# Patient Record
Sex: Female | Born: 1980 | Race: Black or African American | Hispanic: No | Marital: Single | Smoking: Former smoker
Health system: Southern US, Community
[De-identification: ages and names within clinical notes are randomized; demographics above are authoritative.]

## PROBLEM LIST (undated history)

## (undated) DIAGNOSIS — O039 Complete or unspecified spontaneous abortion without complication: Secondary | ICD-10-CM

## (undated) DIAGNOSIS — I10 Essential (primary) hypertension: Secondary | ICD-10-CM

## (undated) DIAGNOSIS — E079 Disorder of thyroid, unspecified: Secondary | ICD-10-CM

---

## 1999-03-03 ENCOUNTER — Encounter (INDEPENDENT_AMBULATORY_CARE_PROVIDER_SITE_OTHER): Payer: Self-pay

## 1999-03-03 ENCOUNTER — Inpatient Hospital Stay (HOSPITAL_COMMUNITY): Admission: AD | Admit: 1999-03-03 | Discharge: 1999-03-03 | Payer: Self-pay | Admitting: *Deleted

## 2004-10-01 ENCOUNTER — Ambulatory Visit: Payer: Self-pay | Admitting: Family Medicine

## 2004-10-16 ENCOUNTER — Emergency Department (HOSPITAL_COMMUNITY): Admission: EM | Admit: 2004-10-16 | Discharge: 2004-10-16 | Payer: Self-pay | Admitting: Emergency Medicine

## 2005-01-04 ENCOUNTER — Ambulatory Visit: Payer: Self-pay | Admitting: Family Medicine

## 2005-04-05 ENCOUNTER — Ambulatory Visit: Payer: Self-pay | Admitting: Family Medicine

## 2005-06-13 ENCOUNTER — Ambulatory Visit: Payer: Self-pay | Admitting: Family Medicine

## 2005-09-27 ENCOUNTER — Ambulatory Visit: Payer: Self-pay | Admitting: Family Medicine

## 2006-06-03 ENCOUNTER — Ambulatory Visit: Payer: Self-pay | Admitting: Family Medicine

## 2006-08-15 ENCOUNTER — Emergency Department (HOSPITAL_COMMUNITY): Admission: EM | Admit: 2006-08-15 | Discharge: 2006-08-15 | Payer: Self-pay | Admitting: Emergency Medicine

## 2008-02-02 ENCOUNTER — Other Ambulatory Visit: Admission: RE | Admit: 2008-02-02 | Discharge: 2008-02-02 | Payer: Self-pay | Admitting: Unknown Physician Specialty

## 2008-02-02 ENCOUNTER — Encounter (INDEPENDENT_AMBULATORY_CARE_PROVIDER_SITE_OTHER): Payer: Self-pay | Admitting: Unknown Physician Specialty

## 2012-03-30 ENCOUNTER — Emergency Department (HOSPITAL_COMMUNITY)
Admission: EM | Admit: 2012-03-30 | Discharge: 2012-03-30 | Disposition: A | Discharge status: Home / Self Care | Payer: Self-pay | Attending: Emergency Medicine | Admitting: Emergency Medicine

## 2012-03-30 ENCOUNTER — Encounter (HOSPITAL_COMMUNITY): Payer: Self-pay | Admitting: Emergency Medicine

## 2012-03-30 DIAGNOSIS — I10 Essential (primary) hypertension: Secondary | ICD-10-CM | POA: Insufficient documentation

## 2012-03-30 DIAGNOSIS — J329 Chronic sinusitis, unspecified: Secondary | ICD-10-CM

## 2012-03-30 DIAGNOSIS — H109 Unspecified conjunctivitis: Secondary | ICD-10-CM | POA: Insufficient documentation

## 2012-03-30 DIAGNOSIS — H1030 Unspecified acute conjunctivitis, unspecified eye: Secondary | ICD-10-CM

## 2012-03-30 DIAGNOSIS — F17200 Nicotine dependence, unspecified, uncomplicated: Secondary | ICD-10-CM | POA: Insufficient documentation

## 2012-03-30 HISTORY — DX: Essential (primary) hypertension: I10

## 2012-03-30 MED ORDER — TOBRAMYCIN 0.3 % OP SOLN
1.0000 [drp] | Freq: Once | OPHTHALMIC | Status: AC
  Administered 2012-03-30: 1 [drp] via OPHTHALMIC
  Filled 2012-03-30: qty 5

## 2012-03-30 MED ORDER — AMOXICILLIN 500 MG PO CAPS: 500.0000 mg | ORAL_CAPSULE | Freq: Three times a day (TID) | ORAL | Status: AC

## 2012-03-30 MED ORDER — AZITHROMYCIN 250 MG PO TABS
500.0000 mg | ORAL_TABLET | Freq: Once | ORAL | Status: AC
  Administered 2012-03-30: 500 mg via ORAL
  Filled 2012-03-30: qty 2

## 2012-03-30 NOTE — ED Notes (Signed)
Sore throat and nasal congestion for one week. Woke up with left eye drainage and pain this am.

## 2012-03-30 NOTE — ED Notes (Signed)
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

## 2012-04-04 NOTE — ED Provider Notes (Signed)
History     CSN: [id number]  Arrival date & time 03/30/12  4098   First MD Initiated Contact with Patient 03/30/12 [phone]      Chief Complaint  Patient presents with  . Sore Throat  . Eye Pain    (Consider location/radiation/quality/duration/timing/severity/associated sxs/prior treatment) Patient is a 31 y.o. female presenting with pharyngitis. The history is provided by the patient.  Sore Throat This is a new problem. The current episode started in the past 7 days. The problem occurs constantly. The problem has been unchanged. Associated symptoms include congestion, a sore throat and a visual change. Pertinent negatives include no abdominal pain, arthralgias, chest pain, chills, coughing, fever, headaches, myalgias, nausea, neck pain, numbness, swollen glands, vomiting or weakness. Associated symptoms comments: Sinus pressure and nasal congestion. The symptoms are aggravated by swallowing. She has tried nothing for the symptoms. The treatment provided no relief.    Past Medical History  Diagnosis Date  . Hypertension     History reviewed. No pertinent past surgical history.  History reviewed. No pertinent family history.  History  Substance Use Topics  . Smoking status: Current Some Day Smoker    Types: Cigarettes  . Smokeless tobacco: Not on file  . Alcohol Use: Yes     occ    OB History    Grav Para Term Preterm Abortions TAB SAB Ect Mult Living                  Review of Systems  Constitutional: Negative for fever, chills, activity change and appetite change.  HENT: Positive for congestion, sore throat, rhinorrhea and sinus pressure. Negative for facial swelling, trouble swallowing, neck pain and neck stiffness.   Eyes: Negative for visual disturbance.  Respiratory: Negative for cough, shortness of breath, wheezing and stridor.   Cardiovascular: Negative for chest pain.  Gastrointestinal: Negative for nausea, vomiting and abdominal pain.  Genitourinary: Negative  for dysuria.  Musculoskeletal: Negative for myalgias and arthralgias.  Skin: Negative.   Neurological: Negative for dizziness, weakness, numbness and headaches.  Hematological: Negative for adenopathy.  Psychiatric/Behavioral: Negative for confusion.  All other systems reviewed and are negative.    Allergies  Review of patient's allergies indicates no known allergies.  Home Medications   Current Outpatient Rx  Name Route Sig Dispense Refill  . AMOXICILLIN 500 MG PO CAPS Oral Take 1 capsule (500 mg total) by mouth 3 (three) times daily. For 10 days 30 capsule 0    BP 146/75  Pulse 75  Temp 98.1 F (36.7 C) (Oral)  Resp 16  Ht 5\' 6"  (1.676 m)  Wt 220 lb (99.791 kg)  BMI 35.51 kg/m2  SpO2 100%  LMP 03/01/2012  Physical Exam  Nursing note and vitals reviewed. Constitutional: She is oriented to person, place, and time. She appears well-developed and well-nourished. No distress.  HENT:  Head: Normocephalic and atraumatic. No trismus in the jaw.  Right Ear: Tympanic membrane and ear canal normal.  Left Ear: Tympanic membrane and ear canal normal.  Nose: Mucosal edema and rhinorrhea present.  Mouth/Throat: Uvula is midline and mucous membranes are normal. No uvula swelling. Posterior oropharyngeal erythema present. No oropharyngeal exudate, posterior oropharyngeal edema or tonsillar abscesses.  Eyes: EOM are normal. Pupils are equal, round, and reactive to light. Right eye exhibits no discharge and no hordeolum. No foreign body present in the right eye. Left eye exhibits discharge. Left eye exhibits no hordeolum. No foreign body present in the left eye. Right conjunctiva is not injected.  Right conjunctiva has no hemorrhage. Left conjunctiva is injected. Left conjunctiva has no hemorrhage. No scleral icterus. Left eye exhibits normal extraocular motion.  Neck: Normal range of motion and phonation normal. Neck supple. No Brudzinski's sign and no Kernig's sign noted.  Cardiovascular:  Normal rate, regular rhythm, normal heart sounds and intact distal pulses.   No murmur heard. Pulmonary/Chest: Effort normal and breath sounds normal. She has no wheezes. She has no rales.  Abdominal: Soft. Bowel sounds are normal.  Musculoskeletal: She exhibits no edema.  Lymphadenopathy:    She has no cervical adenopathy.  Neurological: She is alert and oriented to person, place, and time. She exhibits normal muscle tone. Coordination normal.  Skin: Skin is warm and dry.    ED Course  Procedures (including critical care time)  Labs Reviewed - No data to display No results found.   1. Sinusitis   2. Conjunctivitis, acute       MDM   Vitals stable,  Pt is non-toxic appearing.  No focal neuro deficits, no meningeal signs.   Patient agrees to close f/u with PMD or to return here if the symptoms worsen.    The patient appears reasonably screened and/or stabilized for discharge and I doubt any other medical condition or other [facility] requiring further screening, evaluation, or treatment in the ED at this time prior to discharge.    Prescribed amoxil   Angeni Chaudhuri L. [place]ars, [place] 04/04/12 2134

## 2012-04-05 NOTE — ED Provider Notes (Signed)
Medical screening examination/treatment/procedure(s) were performed by non-physician practitioner and as supervising physician I was immediately available for consultation/collaboration.   Josiel Gahm L Jedrick Hutcherson, MD 04/05/12 1235 

## 2013-12-09 ENCOUNTER — Emergency Department (HOSPITAL_COMMUNITY)
Admission: EM | Admit: 2013-12-09 | Discharge: 2013-12-09 | Disposition: A | Discharge status: Home / Self Care | Payer: Medicaid Other | Attending: Emergency Medicine | Admitting: Emergency Medicine

## 2013-12-09 ENCOUNTER — Encounter (HOSPITAL_COMMUNITY): Payer: Self-pay | Admitting: Emergency Medicine

## 2013-12-09 DIAGNOSIS — R259 Unspecified abnormal involuntary movements: Secondary | ICD-10-CM | POA: Insufficient documentation

## 2013-12-09 DIAGNOSIS — R002 Palpitations: Secondary | ICD-10-CM | POA: Insufficient documentation

## 2013-12-09 DIAGNOSIS — F17200 Nicotine dependence, unspecified, uncomplicated: Secondary | ICD-10-CM | POA: Insufficient documentation

## 2013-12-09 DIAGNOSIS — R42 Dizziness and giddiness: Secondary | ICD-10-CM | POA: Insufficient documentation

## 2013-12-09 DIAGNOSIS — I10 Essential (primary) hypertension: Secondary | ICD-10-CM | POA: Insufficient documentation

## 2013-12-09 DIAGNOSIS — H539 Unspecified visual disturbance: Secondary | ICD-10-CM | POA: Insufficient documentation

## 2013-12-09 LAB — CBC WITH DIFFERENTIAL/PLATELET
BASOS ABS: 0 10*3/uL (ref 0.0–0.1)
BASOS PCT: 0 % (ref 0–1)
EOS ABS: 0.1 10*3/uL (ref 0.0–0.7)
Eosinophils Relative: 1 % (ref 0–5)
HCT: 37.5 % (ref 36.0–46.0)
Hemoglobin: 12.2 g/dL (ref 12.0–15.0)
Lymphocytes Relative: 28 % (ref 12–46)
Lymphs Abs: 2.2 10*3/uL (ref 0.7–4.0)
MCH: 23.7 pg — AB (ref 26.0–34.0)
MCHC: 32.5 g/dL (ref 30.0–36.0)
MCV: 73 fL — ABNORMAL LOW (ref 78.0–100.0)
MONO ABS: 0.6 10*3/uL (ref 0.1–1.0)
Monocytes Relative: 8 % (ref 3–12)
NEUTROS PCT: 63 % (ref 43–77)
Neutro Abs: 5.1 10*3/uL (ref 1.7–7.7)
PLATELETS: 438 10*3/uL — AB (ref 150–400)
RBC: 5.14 MIL/uL — ABNORMAL HIGH (ref 3.87–5.11)
RDW: 14.9 % (ref 11.5–15.5)
WBC: 8 10*3/uL (ref 4.0–10.5)

## 2013-12-09 LAB — COMPREHENSIVE METABOLIC PANEL
ALT: 12 U/L (ref 0–35)
AST: 14 U/L (ref 0–37)
Albumin: 3.6 g/dL (ref 3.5–5.2)
Alkaline Phosphatase: 48 U/L (ref 39–117)
BUN: 11 mg/dL (ref 6–23)
CALCIUM: 9 mg/dL (ref 8.4–10.5)
CO2: 27 mEq/L (ref 19–32)
Chloride: 102 mEq/L (ref 96–112)
Creatinine, Ser: 0.76 mg/dL (ref 0.50–1.10)
GFR calc non Af Amer: >90 mL/min (ref >90)
GLUCOSE: 94 mg/dL (ref 70–99)
Potassium: 3.9 mEq/L (ref 3.7–5.3)
SODIUM: 140 meq/L (ref 137–147)
Total Bilirubin: <0.2 mg/dL — ABNORMAL LOW (ref 0.3–1.2)
Total Protein: 7.4 g/dL (ref 6.0–8.3)

## 2013-12-09 NOTE — ED Notes (Signed)
Had a dizzy spell while driving today and had to pull over.  States she still feels shaky.

## 2013-12-09 NOTE — Discharge Instructions (Signed)
Follow up if you have any more episodes

## 2013-12-09 NOTE — ED Provider Notes (Signed)
CSN: [id number]     Arrival date & time 12/09/13  1618 History  This chart was scribed for Tammy LennertJoseph L Tynell Winchell, MD by Dorothey Basemania Sutton, ED Scribe. This patient was seen in room APA15/APA15 and the patient's care was started at 6:29 PM.    Chief Complaint  Patient presents with  . Dizziness   Patient is a 33 y.o. female presenting with dizziness. The history is provided by the patient. No language interpreter was used.  Dizziness Severity:  Moderate Onset quality:  Sudden Timing:  Constant Progression:  Resolved Chronicity:  New Relieved by:  None tried Worsened by:  Nothing tried Ineffective treatments:  None tried Associated symptoms: palpitations and vision changes   Associated symptoms: no chest pain, no diarrhea, no headaches and no nausea   Risk factors: no hx of stroke and no hx of vertigo    HPI Comments: Tammy Rush is a 33 y.o. Female with a history of HTN who presents to the Emergency Department complaining of a sudden onset episode of dizziness, described as "the road wobbling," with associated tremors and palpitations that occurred earlier today while driving. Patient states that this episode caused her to pull over because Tammy Rush was unable to continue driving. Tammy Rush denies any head spinning sensation. Tammy Rush states that the sensation lasted about 3 hours, but all of her symptoms have since resolved. Patient denies nausea. Patient has no other pertinent medical history.   Past Medical History  Diagnosis Date  . Hypertension    History reviewed. No pertinent past surgical history. History reviewed. No pertinent family history. History  Substance Use Topics  . Smoking status: Current Some Day Smoker    Types: Cigarettes  . Smokeless tobacco: Not on file  . Alcohol Use: Yes     Comment: occ   OB History   Grav Para Term Preterm Abortions TAB SAB Ect Mult Living                 Review of Systems  Constitutional: Negative for appetite change and fatigue.  HENT: Negative for  congestion, ear discharge and sinus pressure.   Eyes: Positive for visual disturbance. Negative for discharge.  Respiratory: Negative for cough.   Cardiovascular: Positive for palpitations. Negative for chest pain.  Gastrointestinal: Negative for nausea, abdominal pain and diarrhea.  Genitourinary: Negative for frequency and hematuria.  Musculoskeletal: Negative for back pain.  Skin: Negative for rash.  Neurological: Positive for dizziness and tremors. Negative for seizures and headaches.  Psychiatric/Behavioral: Negative for hallucinations.      Allergies  Review of patient's allergies indicates no known allergies.  Home Medications   Prior to Admission medications   Not on File   Triage Vitals: BP 153/95  Pulse 84  Temp(Src) 97.6 F (36.4 C) (Oral)  Ht 5\' 6"  (1.676 m)  Wt 214 lb (97.07 kg)  BMI 34.56 kg/m2  SpO2 97%  LMP 12/02/2013  Physical Exam  Constitutional: Tammy Rush is oriented to person, place, and time. Tammy Rush appears well-developed.  HENT:  Head: Normocephalic.  Right Ear: Hearing, tympanic membrane, external ear and ear canal normal.  Left Ear: Hearing, tympanic membrane, external ear and ear canal normal.  Eyes: Conjunctivae and EOM are normal. No scleral icterus.  Neck: Neck supple. No thyromegaly present.  Cardiovascular: Normal rate and regular rhythm.  Exam reveals no gallop and no friction rub.   No murmur heard. Pulmonary/Chest: No stridor. Tammy Rush has no wheezes. Tammy Rush has no rales. Tammy Rush exhibits no tenderness.  Abdominal: Tammy Rush exhibits no  distension. There is no tenderness. There is no rebound.  Musculoskeletal: Normal range of motion. Tammy Rush exhibits no edema.  Lymphadenopathy:    Tammy Rush has no cervical adenopathy.  Neurological: Tammy Rush is oriented to person, place, and time. Tammy Rush exhibits normal muscle tone. Coordination normal.  Skin: No rash noted. No erythema.  Psychiatric: Tammy Rush has a normal mood and affect. Her behavior is normal.    ED Course  Procedures  (including critical care time)  DIAGNOSTIC STUDIES: Oxygen Saturation is 97% on room air, normal by my interpretation.    COORDINATION OF CARE: 6:32 PM- Ordered CMP, CBC, and EKG. Discussed treatment plan with patient at bedside and patient verbalized agreement.     Labs Review Labs Reviewed  CBC WITH DIFFERENTIAL - Abnormal; Notable for the following:    RBC 5.14 (*)    MCV 73.0 (*)    MCH 23.7 (*)    Platelets 438 (*)    All other components within normal limits  COMPREHENSIVE METABOLIC PANEL - Abnormal; Notable for the following:    Total Bilirubin <0.2 (*)    All other components within normal limits    Imaging Review No results found.   EKG Interpretation   Date/Time:  Thursday December 09 2013 18:40:47 EDT Ventricular Rate:  75 PR Interval:  196 QRS Duration: 76 QT Interval:  400 QTC Calculation: 446 R Axis:   23 Text Interpretation:  Sinus rhythm with Fusion complexes Otherwise normal  ECG No previous ECGs available Confirmed by Adali Pennings  MD, Jomarie LongsJOSEPH [phone]([id number]) on  12/09/2013 9:23:55 PM      MDM   Final diagnoses:  None   The chart was scribed for me under my direct supervision.  I personally performed the history, physical, and medical decision making and all procedures in the evaluation of this patient.Tammy Lennert.    Roderick Sweezy L Raye Wiens, MD 12/09/13 2125

## 2014-10-29 ENCOUNTER — Emergency Department (HOSPITAL_COMMUNITY)
Admission: EM | Admit: 2014-10-29 | Discharge: 2014-10-29 | Disposition: A | Discharge status: Home / Self Care | Payer: Medicaid Other | Attending: Emergency Medicine | Admitting: Emergency Medicine

## 2014-10-29 ENCOUNTER — Emergency Department (HOSPITAL_COMMUNITY): Payer: Medicaid Other

## 2014-10-29 ENCOUNTER — Encounter (HOSPITAL_COMMUNITY): Payer: Self-pay | Admitting: *Deleted

## 2014-10-29 DIAGNOSIS — O200 Threatened abortion: Secondary | ICD-10-CM

## 2014-10-29 DIAGNOSIS — O10011 Pre-existing essential hypertension complicating pregnancy, first trimester: Secondary | ICD-10-CM | POA: Diagnosis not present

## 2014-10-29 DIAGNOSIS — Z79899 Other long term (current) drug therapy: Secondary | ICD-10-CM | POA: Diagnosis not present

## 2014-10-29 DIAGNOSIS — Z3490 Encounter for supervision of normal pregnancy, unspecified, unspecified trimester: Secondary | ICD-10-CM

## 2014-10-29 DIAGNOSIS — O99331 Smoking (tobacco) complicating pregnancy, first trimester: Secondary | ICD-10-CM | POA: Insufficient documentation

## 2014-10-29 DIAGNOSIS — Z3A01 Less than 8 weeks gestation of pregnancy: Secondary | ICD-10-CM | POA: Insufficient documentation

## 2014-10-29 DIAGNOSIS — F17210 Nicotine dependence, cigarettes, uncomplicated: Secondary | ICD-10-CM | POA: Diagnosis not present

## 2014-10-29 DIAGNOSIS — O209 Hemorrhage in early pregnancy, unspecified: Secondary | ICD-10-CM | POA: Diagnosis present

## 2014-10-29 DIAGNOSIS — O4690 Antepartum hemorrhage, unspecified, unspecified trimester: Secondary | ICD-10-CM

## 2014-10-29 LAB — URINALYSIS, ROUTINE W REFLEX MICROSCOPIC
BILIRUBIN URINE: NEGATIVE
GLUCOSE, UA: NEGATIVE mg/dL
Ketones, ur: NEGATIVE mg/dL
Leukocytes, UA: NEGATIVE
Nitrite: NEGATIVE
Specific Gravity, Urine: 1.02 (ref 1.005–1.030)
Urobilinogen, UA: 1 mg/dL (ref 0.0–1.0)
pH: 7.5 (ref 5.0–8.0)

## 2014-10-29 LAB — WET PREP, GENITAL
Trich, Wet Prep: NONE SEEN
YEAST WET PREP: NONE SEEN

## 2014-10-29 LAB — HCG, QUANTITATIVE, PREGNANCY: HCG, BETA CHAIN, QUANT, S: 437 m[IU]/mL — AB (ref <5)

## 2014-10-29 LAB — POC URINE PREG, ED: Preg Test, Ur: POSITIVE — AB

## 2014-10-29 LAB — ABO/RH: ABO/RH(D): A POS

## 2014-10-29 LAB — URINE MICROSCOPIC-ADD ON

## 2014-10-29 MED ORDER — CEPHALEXIN 500 MG PO CAPS: ORAL_CAPSULE | ORAL | Status: DC

## 2014-10-29 MED ORDER — CEPHALEXIN 500 MG PO CAPS
500.0000 mg | ORAL_CAPSULE | Freq: Once | ORAL | Status: AC
  Administered 2014-10-29: 500 mg via ORAL
  Filled 2014-10-29: qty 1

## 2014-10-29 MED ORDER — NAT-RUL PRENATAL VITAMINS 28-0.8 MG PO TABS: 1.0000 | ORAL_TABLET | Freq: Every morning | ORAL | Status: DC

## 2014-10-29 NOTE — Discharge Instructions (Signed)
Threatened Miscarriage Go to your local health department to get prenatal care. Take the antibiotic as prescribed. You need to stop smoking immediately. Ask your doctor for help. Do not take anymore birth control pills A threatened miscarriage is when you have vaginal bleeding during your first 20 weeks of pregnancy but the pregnancy has not ended. Your doctor will do tests to make sure you are still pregnant. The cause of the bleeding may not be known. This condition does not mean your pregnancy will end. It does increase the risk of it ending (complete miscarriage). HOME CARE   Make sure you keep all your doctor visits for prenatal care.  Get plenty of rest.  Do not have sex or use tampons if you have vaginal bleeding.  Do not douche.  Do not smoke or use drugs.  Do not drink alcohol.  Avoid caffeine. GET HELP IF:  You have light bleeding from your vagina.  You have belly pain or cramping.  You have a fever. GET HELP RIGHT AWAY IF:   You have heavy bleeding from your vagina.  You have clots of blood coming from your vagina.  You have bad pain or cramps in your low back or belly.  You have fever, chills, and bad belly pain. MAKE SURE YOU:   Understand these instructions.  Will watch your condition.  Will get help right away if you are not doing well or get worse. Document Released: 07/18/2008 Document Revised: 08/10/2013 Document Reviewed: 06/01/2013 [facility] Patient Information 2015 [place], [place]. This information is not intended to replace advice given to you by your health care provider. Make sure you discuss any questions you have with your health care provider.

## 2014-10-29 NOTE — ED Provider Notes (Signed)
CSN: [id number]     Arrival date & time 10/29/14  1542 History   First MD Initiated Contact with Patient 10/29/14 1702     Chief Complaint  Patient presents with  . Vaginal Bleeding     (Consider location/radiation/quality/duration/timing/severity/associated sxs/prior Treatment) HPI  Complained of vaginal bleeding onset 4 hours ago noticed slight amount of blood when she wiped. Patient learned she was pregnant by home pregnancy test one week ago. Last normal menstrual period first week of February 2016. She complains of mild cramping in her back. Denies urinary symptoms denies abdominal pain. No other associated symptoms. No treatment prior to coming here. No lightheadedness no other associated symptoms. Nothing makes symptoms better or worse. She stopped her birth control pills last week after learning that she was pregnant Past Medical History  Diagnosis Date  . Hypertension    History reviewed. No pertinent past surgical history. History reviewed. No pertinent family history. History  Substance Use Topics  . Smoking status: Current Some Day Smoker    Types: Cigarettes  . Smokeless tobacco: Not on file  . Alcohol Use: Yes     Comment: occ   OB History    Gravida Para Term Preterm AB TAB SAB Ectopic Multiple Living            1     gravida 3 para 40981 term vaginal delivery, one miscarriage Review of Systems  Genitourinary: Positive for vaginal bleeding.  Musculoskeletal: Positive for back pain.      Allergies  Review of patient's allergies indicates no known allergies.  Home Medications   Prior to Admission medications   Medication Sig Start Date End Date Taking? Authorizing Provider  metoprolol tartrate (LOPRESSOR) 25 MG tablet Take 25 mg by mouth 2 (two) times daily.    Historical Provider, MD  norethindrone (JENCYCLA) 0.35 MG tablet Take 1 tablet by mouth daily.    Historical Provider, MD   BP 126/72 mmHg  Pulse 90  Temp(Src) 98.3 F (36.8 C) (Oral)  Resp 15   Ht  (1.676 m)  Wt 225 lb (102.059 kg)  BMI 36.33 kg/m2  SpO2 100%  LMP 09/19/2014 Physical Exam  Constitutional: She appears well-developed and well-nourished.  HENT:  Head: Normocephalic and atraumatic.  Eyes: Conjunctivae are normal. Pupils are equal, round, and reactive to light.  Neck: Neck supple. No tracheal deviation present. No thyromegaly present.  Cardiovascular: Normal rate and regular rhythm.   No murmur heard. Pulmonary/Chest: Effort normal and breath sounds normal.  Abdominal: Soft. Bowel sounds are normal. She exhibits no distension. There is no tenderness.  Obese  Genitourinary:  No external lesion. Slight amount of blood in vaginal vault. Cervical os closed. No cervical motion tenderness no adnexal masses or tenderness  Musculoskeletal: Normal range of motion. She exhibits no edema or tenderness.  Neurological: She is alert. Coordination normal.  Skin: Skin is warm and dry. No rash noted.  Psychiatric: She has a normal mood and affect.  Nursing note and vitals reviewed.   ED Course  Procedures (including critical care time) Labs Review Labs Reviewed - No data to display  Imaging Review No results found.   EKG Interpretation None      7:40 PM patient resting comfortably. No distress. Results for orders placed or performed during the hospital encounter of 10/29/14  Wet prep, genital  Result Value Ref Range   Yeast Wet Prep HPF POC NONE SEEN NONE SEEN   Trich, Wet Prep NONE SEEN NONE SEEN   Clue  Cells Wet Prep HPF POC FEW (A) NONE SEEN   WBC, Wet Prep HPF POC FEW (A) NONE SEEN  Urinalysis, Routine w reflex microscopic  Result Value Ref Range   Color, Urine YELLOW YELLOW   APPearance HAZY (A) CLEAR   Specific Gravity, Urine 1.020 1.005 - 1.030   pH 7.5 5.0 - 8.0   Glucose, UA NEGATIVE NEGATIVE mg/dL   Hgb urine dipstick LARGE (A) NEGATIVE   Bilirubin Urine NEGATIVE NEGATIVE   Ketones, ur NEGATIVE NEGATIVE mg/dL   Protein, ur TRACE (A) NEGATIVE  mg/dL   Urobilinogen, UA 1.0 0.0 - 1.0 mg/dL   Nitrite NEGATIVE NEGATIVE   Leukocytes, UA NEGATIVE NEGATIVE  hCG, quantitative, pregnancy  Result Value Ref Range   hCG, Beta Chain, Quant, S 437 (H) <5 mIU/mL  Urine microscopic-add on  Result Value Ref Range   Squamous Epithelial / LPF FEW (A) RARE   WBC, UA 0-2 <3 WBC/hpf   RBC / HPF 3-6 <3 RBC/hpf   Bacteria, UA MANY (A) RARE  POC Urine Pregnancy, ED ([facility])  Result Value Ref Range   Preg Test, Ur POSITIVE (A) NEGATIVE  ABO/Rh  Result Value Ref Range   ABO/RH(D) A POS    [place] Ob Comp Less 14 Wks  10/29/2014   CLINICAL DATA:  34 year old G3 P1 SAB1, LMP 09/19/2014 (5 weeks 5 days), positive urine pregnancy test yesterday, quantitative beta HCG 437, presenting with vaginal bleeding.  EXAM: OBSTETRIC <14 WK US AND TRANSVAGINAL OB US  TECHNIQUE: Both transabdominal and transvaginal ultrasound examinations were performed for complete evaluation of the gestation as well as the maternal uterus, adnexal regions, and pelvic cul-de-sac. Transvaginal technique was performed to assess early pregnancy.  COMPARISON:  None.  FINDINGS: Intrauterine gestational sac: Single, normal oval shaped.  Yolk sac:  Not visualized.  Embryo:  Not visualized.  Cardiac Activity: Not visualized.  MSD: 4.3 mm   5 w   1 d  US EDC: 06/30/2015.  Maternal uterus/adnexae: No evidence of subchorionic hemorrhage. Left ovary measures approximately 3.4 x 1.9 x 2.2 cm with visible color Doppler flow. Two adjacent left paraovarian cysts measure approximately 2.4 x 1.4 x 1.8 cm and 1.5 x 0.9 x 1.8 cm. Right ovary normal in appearance measuring approximately 2.7 x 1.8 x 2.2 cm with visible color Doppler flow. No adnexal masses or free pelvic fluid.  IMPRESSION: 1. Single intrauterine gestational sac. Estimated gestational age [redacted] weeks 1 day by mean sac diameter, corresponding well with the estimated gestational age by LMP of 5 weeks 5 days. 2. The embryo and yolk sac are not yet  visualized due to the early gestational age. Continued monitoring of beta HCG and possible follow-up ultrasound in 2 weeks may be beneficial. 3. Paraovarian cysts adjacent to the left ovary. No other adnexal masses or free pelvic fluid.   Electronically Signed   By: Hulan Saashomas  Lawrence M.D.   On: 10/29/2014 19:20   [place] Ob Transvaginal  10/29/2014   CLINICAL DATA:  34 year old G3 P1 SAB1, LMP 09/19/2014 (5 weeks 5 days), positive urine pregnancy test yesterday, quantitative beta HCG 437, presenting with vaginal bleeding.  EXAM: OBSTETRIC <14 WK US AND TRANSVAGINAL OB US  TECHNIQUE: Both transabdominal and transvaginal ultrasound examinations were performed for complete evaluation of the gestation as well as the maternal uterus, adnexal regions, and pelvic cul-de-sac. Transvaginal technique was performed to assess early pregnancy.  COMPARISON:  None.  FINDINGS: Intrauterine gestational sac: Single, normal oval shaped.  Yolk sac:  Not visualized.  Embryo:  Not visualized.  Cardiac Activity: Not visualized.  MSD: 4.3 mm   5 w   1 d  [place] EDC: 06/30/2015.  Maternal uterus/adnexae: No evidence of subchorionic hemorrhage. Left ovary measures approximately 3.4 x 1.9 x 2.2 cm with visible color Doppler flow. Two adjacent left paraovarian cysts measure approximately 2.4 x 1.4 x 1.8 cm and 1.5 x 0.9 x 1.8 cm. Right ovary normal in appearance measuring approximately 2.7 x 1.8 x 2.2 cm with visible color Doppler flow. No adnexal masses or free pelvic fluid.  IMPRESSION: 1. Single intrauterine gestational sac. Estimated gestational age [redacted] weeks 1 day by mean sac diameter, corresponding well with the estimated gestational age by LMP of 5 weeks 5 days. 2. The embryo and yolk sac are not yet visualized due to the early gestational age. Continued monitoring of beta HCG and possible follow-up ultrasound in 2 weeks may be beneficial. 3. Paraovarian cysts adjacent to the left ovary. No other adnexal masses or free pelvic fluid.    Electronically Signed   By: Hulan Saas M.D.   On: 10/29/2014 19:20    MDM  Plan pelvic rest. Consultation for 5 minutes on smoking cessation Prescription prenatal vitamins., keflex, urine for culture Referral health Department for prenatal care Diagnosis #1 threatened abortion #2 bacteriuria #3 tobacco abuse Final diagnoses:  None        Doug Sou, MD 10/29/14 1948

## 2014-10-29 NOTE — ED Notes (Signed)
Ultrasound called in at this time by xray for r/o ectopic . Tammy Rush

## 2014-10-29 NOTE — ED Notes (Signed)
Pt with vaginal bleeding today, just found yesterday with positive pregnancy test, denies any blood clots, hx of miscarriage x 1 in past

## 2014-10-29 NOTE — ED Notes (Signed)
Patient wanting to know how much longer before she is discharged. EDP just left room.

## 2014-10-30 LAB — RPR: RPR: NONREACTIVE

## 2014-10-30 LAB — HIV ANTIBODY (ROUTINE TESTING W REFLEX): HIV SCREEN 4TH GENERATION: NONREACTIVE

## 2014-10-31 ENCOUNTER — Encounter (HOSPITAL_COMMUNITY): Payer: Self-pay

## 2014-10-31 ENCOUNTER — Emergency Department (HOSPITAL_COMMUNITY): Payer: Medicaid Other

## 2014-10-31 ENCOUNTER — Emergency Department (HOSPITAL_COMMUNITY)
Admission: EM | Admit: 2014-10-31 | Discharge: 2014-10-31 | Disposition: A | Discharge status: Home / Self Care | Payer: Medicaid Other | Attending: Emergency Medicine | Admitting: Emergency Medicine

## 2014-10-31 DIAGNOSIS — O99331 Smoking (tobacco) complicating pregnancy, first trimester: Secondary | ICD-10-CM | POA: Diagnosis not present

## 2014-10-31 DIAGNOSIS — F17210 Nicotine dependence, cigarettes, uncomplicated: Secondary | ICD-10-CM | POA: Diagnosis not present

## 2014-10-31 DIAGNOSIS — O99211 Obesity complicating pregnancy, first trimester: Secondary | ICD-10-CM | POA: Insufficient documentation

## 2014-10-31 DIAGNOSIS — O200 Threatened abortion: Secondary | ICD-10-CM | POA: Insufficient documentation

## 2014-10-31 DIAGNOSIS — Z79899 Other long term (current) drug therapy: Secondary | ICD-10-CM | POA: Insufficient documentation

## 2014-10-31 DIAGNOSIS — O039 Complete or unspecified spontaneous abortion without complication: Secondary | ICD-10-CM

## 2014-10-31 DIAGNOSIS — O10011 Pre-existing essential hypertension complicating pregnancy, first trimester: Secondary | ICD-10-CM | POA: Insufficient documentation

## 2014-10-31 DIAGNOSIS — O4690 Antepartum hemorrhage, unspecified, unspecified trimester: Secondary | ICD-10-CM

## 2014-10-31 DIAGNOSIS — Z3A01 Less than 8 weeks gestation of pregnancy: Secondary | ICD-10-CM | POA: Diagnosis not present

## 2014-10-31 DIAGNOSIS — O9989 Other specified diseases and conditions complicating pregnancy, childbirth and the puerperium: Secondary | ICD-10-CM | POA: Diagnosis present

## 2014-10-31 DIAGNOSIS — E663 Overweight: Secondary | ICD-10-CM | POA: Insufficient documentation

## 2014-10-31 LAB — CBC WITH DIFFERENTIAL/PLATELET
BASOS ABS: 0 10*3/uL (ref 0.0–0.1)
Basophils Relative: 0 % (ref 0–1)
EOS PCT: 0 % (ref 0–5)
Eosinophils Absolute: 0 10*3/uL (ref 0.0–0.7)
HEMATOCRIT: 37.7 % (ref 36.0–46.0)
Hemoglobin: 12 g/dL (ref 12.0–15.0)
LYMPHS ABS: 1.6 10*3/uL (ref 0.7–4.0)
Lymphocytes Relative: 23 % (ref 12–46)
MCH: 23.9 pg — ABNORMAL LOW (ref 26.0–34.0)
MCHC: 31.8 g/dL (ref 30.0–36.0)
MCV: 75 fL — AB (ref 78.0–100.0)
MONO ABS: 0.5 10*3/uL (ref 0.1–1.0)
Monocytes Relative: 7 % (ref 3–12)
NEUTROS ABS: 4.8 10*3/uL (ref 1.7–7.7)
Neutrophils Relative %: 70 % (ref 43–77)
Platelets: 372 10*3/uL (ref 150–400)
RBC: 5.03 MIL/uL (ref 3.87–5.11)
RDW: 15 % (ref 11.5–15.5)
WBC: 6.9 10*3/uL (ref 4.0–10.5)

## 2014-10-31 LAB — HCG, QUANTITATIVE, PREGNANCY: hCG, Beta Chain, Quant, S: 415 m[IU]/mL — ABNORMAL HIGH (ref <5)

## 2014-10-31 LAB — URINE CULTURE: Special Requests: NORMAL

## 2014-10-31 LAB — GC/CHLAMYDIA PROBE AMP (~~LOC~~) NOT AT ARMC
CHLAMYDIA, DNA PROBE: NEGATIVE
NEISSERIA GONORRHEA: NEGATIVE

## 2014-10-31 NOTE — ED Provider Notes (Signed)
CSN: [id number]     Arrival date & time 10/31/14  1043 History  This chart was scribed for No att. providers found by Tonye Royalty, ED Scribe. This patient was seen in room APA12/APA12 and the patient's care was started at 11:39 AM.    Chief Complaint  Patient presents with  . Vaginal Bleeding   The history is provided by the patient. No language interpreter was used.    HPI Comments: Tammy Rush is a pregnant 34 y.o. female who presents to the Emergency Department complaining of vaginal bleeding with onset 2 days ago. She was evaluated here 2 days ago and diagnosed with a threatened miscarriage. Since then, she has passed a large clot and bleeding has increased. She reports associated abdominal cramps. Her last period was February 1st. She is G:3 P:1. She denies lightheadedness or other symptoms.  Past Medical History  Diagnosis Date  . Hypertension    History reviewed. No pertinent past surgical history. No family history on file. History  Substance Use Topics  . Smoking status: Current Some Day Smoker    Types: Cigarettes  . Smokeless tobacco: Not on file  . Alcohol Use: Yes     Comment: occ   OB History    Gravida Para Term Preterm AB TAB SAB Ectopic Multiple Living            1     Review of Systems  Respiratory: Negative for chest tightness and shortness of breath.   Cardiovascular: Negative for chest pain.  Gastrointestinal: Positive for abdominal pain. Negative for nausea and vomiting.  Genitourinary: Positive for vaginal bleeding. Negative for dysuria.  Musculoskeletal: Negative for back pain.  Neurological: Negative for dizziness and headaches.  All other systems reviewed and are negative.     Allergies  Review of patient's allergies indicates no known allergies.  Home Medications   Prior to Admission medications   Medication Sig Start Date End Date Taking? Authorizing Provider  metoprolol tartrate (LOPRESSOR) 25 MG tablet Take 25 mg by mouth 2 (two)  times daily.   Yes Historical Provider, MD  norethindrone (JENCYCLA) 0.35 MG tablet Take 1 tablet by mouth daily.   Yes Historical Provider, MD  cephALEXin (KEFLEX) 500 MG capsule 2 caps po bid x 7 days Patient not taking: Reported on 10/31/2014 10/29/14   Doug Sou, MD  Prenatal Vit-Fe Fumarate-FA (NAT-RUL PRENATAL VITAMINS) 28-0.8 MG TABS Take 1 tablet by mouth every morning. Patient not taking: Reported on 10/31/2014 10/29/14   Doug Sou, MD   BP 133/96 mmHg  Pulse 90  Temp(Src) 98.8 F (37.1 C) (Oral)  Resp 18  Ht 5\' 6"  (1.676 m)  Wt 223 lb (101.152 kg)  BMI 36.01 kg/m2  SpO2 100%  LMP 09/19/2014 Physical Exam  Constitutional: She is oriented to person, place, and time. She appears well-developed and well-nourished. No distress.  Overweight  HENT:  Head: Normocephalic and atraumatic.  Cardiovascular: Normal rate, regular rhythm and normal heart sounds.   No murmur heard. Pulmonary/Chest: Effort normal and breath sounds normal. No respiratory distress. She has no wheezes.  Abdominal: Soft. Bowel sounds are normal. There is no tenderness. There is no rebound.  Genitourinary:  Normal external vaginal exam, clot noted in the vaginal vault, cervical os open  Neurological: She is alert and oriented to person, place, and time.  Skin: Skin is warm and dry.  Psychiatric: She has a normal mood and affect.  Nursing note and vitals reviewed.   ED Course  Procedures (including  critical care time)  DIAGNOSTIC STUDIES: Oxygen Saturation is 100% on room air, normal by my interpretation.    COORDINATION OF CARE: 3:31 PM Discussed treatment plan with patient at beside, the patient agrees with the plan and has no further questions at this time.   Labs Review Labs Reviewed  HCG, QUANTITATIVE, PREGNANCY - Abnormal; Notable for the following:    hCG, Beta Chain, Quant, S 415 (*)    All other components within normal limits  CBC WITH DIFFERENTIAL/PLATELET - Abnormal; Notable for  the following:    MCV 75.0 (*)    MCH 23.9 (*)    All other components within normal limits    Imaging Review US Ob Comp Less 14 Wks  10/31/2014   CLINICAL DATA:  Two days of vaginal bleeding with passage of clot, probable early IUP demonstrated on an ultrasound 2 days ago. Beta HCG of 437 on 29 October 2014; it measures 415 today  EXAM: OBSTETRIC <14 WK [place] AND TRANSVAGINAL OB US  TECHNIQUE: Both transabdominal and transvaginal ultrasound examinations were performed for complete evaluation of the gestation as well as the maternal uterus, adnexal regions, and pelvic cul-de-sac. Transvaginal technique was performed to assess early pregnancy.  COMPARISON:  Ultrasound of October 29, 2014  FINDINGS: Intrauterine gestational sac: A well-formed sac is not demonstrated. There is fluid within the endometrial canal.  Yolk sac:  Not visualized  Embryo:  Not visualized  Cardiac Activity: Not visualized  Heart Rate: n/a  bpm  MSD: 5.6  mm   5 w   2  d  Maternal uterus/adnexae: Within the right ovary there is a cystic structure measuring 1.7 cm in diameter. On the left 2 cystic structures are demonstrated with the largest measuring 2.1 cm in diameter.  IMPRESSION: No viable IUP is demonstrated. The findings suggest an incomplete miscarriage. Bilateral ovarian cysts are demonstrated.  Serial follow-up beta HCG determinations and if necessary follow-up ultrasound examinations are recommended.   Electronically Signed   By: David  [place]   On: 10/31/2014 13:35   US Ob Comp Less 14 Wks  10/29/2014   CLINICAL DATA:  34 year old G3 P1 SAB1, LMP 09/19/2014 (5 weeks 5 days), positive urine pregnancy test yesterday, quantitative beta HCG 437, presenting with vaginal bleeding.  EXAM: OBSTETRIC <14 WK [place] AND TRANSVAGINAL OB US  TECHNIQUE: Both transabdominal and transvaginal ultrasound examinations were performed for complete evaluation of the gestation as well as the maternal uterus, adnexal regions, and pelvic cul-de-sac.  Transvaginal technique was performed to assess early pregnancy.  COMPARISON:  None.  FINDINGS: Intrauterine gestational sac: Single, normal oval shaped.  Yolk sac:  Not visualized.  Embryo:  Not visualized.  Cardiac Activity: Not visualized.  MSD: 4.3 mm   5 w   1 d  [place] EDC: 06/30/2015.  Maternal uterus/adnexae: No evidence of subchorionic hemorrhage. Left ovary measures approximately 3.4 x 1.9 x 2.2 cm with visible color Doppler flow. Two adjacent left paraovarian cysts measure approximately 2.4 x 1.4 x 1.8 cm and 1.5 x 0.9 x 1.8 cm. Right ovary normal in appearance measuring approximately 2.7 x 1.8 x 2.2 cm with visible color Doppler flow. No adnexal masses or free pelvic fluid.  IMPRESSION: 1. Single intrauterine gestational sac. Estimated gestational age [redacted] weeks 1 day by mean sac diameter, corresponding well with the estimated gestational age by LMP of 5 weeks 5 days. 2. The embryo and yolk sac are not yet visualized due to the early gestational age. Continued monitoring of beta HCG and  possible follow-up ultrasound in 2 weeks may be beneficial. 3. Paraovarian cysts adjacent to the left ovary. No other adnexal masses or free pelvic fluid.   Electronically Signed   By: Hulan Saashomas  Lawrence M.D.   On: 10/29/2014 19:20   [place] Ob Transvaginal  10/31/2014   CLINICAL DATA:  Two days of vaginal bleeding with passage of clot, probable early IUP demonstrated on an ultrasound 2 days ago. Beta HCG of 437 on 29 October 2014; it measures 415 today  EXAM: OBSTETRIC <14 WK US AND TRANSVAGINAL OB US  TECHNIQUE: Both transabdominal and transvaginal ultrasound examinations were performed for complete evaluation of the gestation as well as the maternal uterus, adnexal regions, and pelvic cul-de-sac. Transvaginal technique was performed to assess early pregnancy.  COMPARISON:  Ultrasound of October 29, 2014  FINDINGS: Intrauterine gestational sac: A well-formed sac is not demonstrated. There is fluid within the endometrial canal.  Yolk  sac:  Not visualized  Embryo:  Not visualized  Cardiac Activity: Not visualized  Heart Rate: n/a  bpm  MSD: 5.6  mm   5 w   2  d  Maternal uterus/adnexae: Within the right ovary there is a cystic structure measuring 1.7 cm in diameter. On the left 2 cystic structures are demonstrated with the largest measuring 2.1 cm in diameter.  IMPRESSION: No viable IUP is demonstrated. The findings suggest an incomplete miscarriage. Bilateral ovarian cysts are demonstrated.  Serial follow-up beta HCG determinations and if necessary follow-up ultrasound examinations are recommended.   Electronically Signed   By: David  [place]   On: 10/31/2014 13:35   [place] Ob Transvaginal  10/29/2014   CLINICAL DATA:  34 year old G3 P1 SAB1, LMP 09/19/2014 (5 weeks 5 days), positive urine pregnancy test yesterday, quantitative beta HCG 437, presenting with vaginal bleeding.  EXAM: OBSTETRIC <14 WK US AND TRANSVAGINAL OB US  TECHNIQUE: Both transabdominal and transvaginal ultrasound examinations were performed for complete evaluation of the gestation as well as the maternal uterus, adnexal regions, and pelvic cul-de-sac. Transvaginal technique was performed to assess early pregnancy.  COMPARISON:  None.  FINDINGS: Intrauterine gestational sac: Single, normal oval shaped.  Yolk sac:  Not visualized.  Embryo:  Not visualized.  Cardiac Activity: Not visualized.  MSD: 4.3 mm   5 w   1 d  US EDC: 06/30/2015.  Maternal uterus/adnexae: No evidence of subchorionic hemorrhage. Left ovary measures approximately 3.4 x 1.9 x 2.2 cm with visible color Doppler flow. Two adjacent left paraovarian cysts measure approximately 2.4 x 1.4 x 1.8 cm and 1.5 x 0.9 x 1.8 cm. Right ovary normal in appearance measuring approximately 2.7 x 1.8 x 2.2 cm with visible color Doppler flow. No adnexal masses or free pelvic fluid.  IMPRESSION: 1. Single intrauterine gestational sac. Estimated gestational age [redacted] weeks 1 day by mean sac diameter, corresponding well with the estimated  gestational age by LMP of 5 weeks 5 days. 2. The embryo and yolk sac are not yet visualized due to the early gestational age. Continued monitoring of beta HCG and possible follow-up ultrasound in 2 weeks may be beneficial. 3. Paraovarian cysts adjacent to the left ovary. No other adnexal masses or free pelvic fluid.   Electronically Signed   By: Hulan Saashomas  Lawrence M.D.   On: 10/29/2014 19:20     EKG Interpretation None      MDM   Final diagnoses:  Miscarriage    Patient presents with worsening vaginal bleeding. Seen 2 days ago and diagnosed with a threatened miscarriage.  Beta hCG at that time 437. Today beta hCG is 415.  Cervical os is open. Discussed with patient that this represents a miscarriage in process and this will not be a viable pregnancy. Ultrasound today shows no gestational sac which was seen 2 days ago. Patient to follow-up with her OB for repeat beta hCG. She was given bleeding precautions. No indication for exam as patient is A+.  After history, exam, and medical workup I feel the patient has been appropriately medically screened and is safe for discharge home. Pertinent diagnoses were discussed with the patient. Patient was given return precautions.  I personally performed the services described in this documentation, which was scribed in my presence. The recorded information has been reviewed and is accurate.   Shon Baton, MD 10/31/14 1535

## 2014-10-31 NOTE — ED Notes (Signed)
Pt reports had a positive pregnancy test at home Friday.  Reports started having vaginal bleeding sat and was evaluated here.  Today went to the health dept and reports passed a large blood clot and her bp was elevated.  Reports was sent here for eval.

## 2014-10-31 NOTE — Discharge Instructions (Signed)
It appears you're having a miscarriage. You need to follow-up with your OB for a repeat beta hCG to make sure it continues to decrease. Today's beta hCG was 415. If you have worsening bleeding, abdominal cramping, any new or worsening symptoms she should be reevaluated.  Miscarriage A miscarriage is the sudden loss of an unborn baby (fetus) before the 20th week of pregnancy. Most miscarriages happen in the first 3 months of pregnancy. Sometimes, it happens before a woman even knows she is pregnant. A miscarriage is also called a "spontaneous miscarriage" or "early pregnancy loss." Having a miscarriage can be an emotional experience. Talk with your caregiver about any questions you may have about miscarrying, the grieving process, and your future pregnancy plans. CAUSES   Problems with the fetal chromosomes that make it impossible for the baby to develop normally. Problems with the baby's genes or chromosomes are most often the result of errors that occur, by chance, as the embryo divides and grows. The problems are not inherited from the parents.  Infection of the cervix or uterus.   Hormone problems.   Problems with the cervix, such as having an incompetent cervix. This is when the tissue in the cervix is not strong enough to hold the pregnancy.   Problems with the uterus, such as an abnormally shaped uterus, uterine fibroids, or congenital abnormalities.   Certain medical conditions.   Smoking, drinking alcohol, or taking illegal drugs.   Trauma.  Often, the cause of a miscarriage is unknown.  SYMPTOMS   Vaginal bleeding or spotting, with or without cramps or pain.  Pain or cramping in the abdomen or lower back.  Passing fluid, tissue, or blood clots from the vagina. DIAGNOSIS  Your caregiver will perform a physical exam. You may also have an ultrasound to confirm the miscarriage. Blood or urine tests may also be ordered. TREATMENT   Sometimes, treatment is not necessary if  you naturally pass all the fetal tissue that was in the uterus. If some of the fetus or placenta remains in the body (incomplete miscarriage), tissue left behind may become infected and must be removed. Usually, a dilation and curettage (D and C) procedure is performed. During a D and C procedure, the cervix is widened (dilated) and any remaining fetal or placental tissue is gently removed from the uterus.  Antibiotic medicines are prescribed if there is an infection. Other medicines may be given to reduce the size of the uterus (contract) if there is a lot of bleeding.  If you have Rh negative blood and your baby was Rh positive, you will need a Rh immunoglobulin shot. This shot will protect any future baby from having Rh blood problems in future pregnancies. HOME CARE INSTRUCTIONS   Your caregiver may order bed rest or may allow you to continue light activity. Resume activity as directed by your caregiver.  Have someone help with home and family responsibilities during this time.   Keep track of the number of sanitary pads you use each day and how soaked (saturated) they are. Write down this information.   Do not use tampons. Do not douche or have sexual intercourse until approved by your caregiver.   Only take over-the-counter or prescription medicines for pain or discomfort as directed by your caregiver.   Do not take aspirin. Aspirin can cause bleeding.   Keep all follow-up appointments with your caregiver.   If you or your partner have problems with grieving, talk to your caregiver or seek counseling to help  cope with the pregnancy loss. Allow enough time to grieve before trying to get pregnant again.  SEEK IMMEDIATE MEDICAL CARE IF:   You have severe cramps or pain in your back or abdomen.  You have a fever.  You pass large blood clots (walnut-sized or larger) ortissue from your vagina. Save any tissue for your caregiver to inspect.   Your bleeding increases.   You  have a thick, bad-smelling vaginal discharge.  You become lightheaded, weak, or you faint.   You have chills.  MAKE SURE YOU:  Understand these instructions.  Will watch your condition.  Will get help right away if you are not doing well or get worse. Document Released: 01/29/2001 Document Revised: 11/30/2012 Document Reviewed: 09/24/2011 [facility] Patient Information 2015 [place], [place]. This information is not intended to replace advice given to you by your health care provider. Make sure you discuss any questions you have with your health care provider.

## 2014-11-24 ENCOUNTER — Encounter: Payer: Medicaid Other | Admitting: Obstetrics & Gynecology

## 2014-12-06 ENCOUNTER — Encounter (HOSPITAL_COMMUNITY): Payer: Self-pay | Admitting: *Deleted

## 2014-12-06 ENCOUNTER — Emergency Department (HOSPITAL_COMMUNITY)
Admission: EM | Admit: 2014-12-06 | Discharge: 2014-12-06 | Disposition: A | Discharge status: Home / Self Care | Payer: Medicaid Other | Attending: Emergency Medicine | Admitting: Emergency Medicine

## 2014-12-06 DIAGNOSIS — Z79899 Other long term (current) drug therapy: Secondary | ICD-10-CM | POA: Diagnosis not present

## 2014-12-06 DIAGNOSIS — N939 Abnormal uterine and vaginal bleeding, unspecified: Secondary | ICD-10-CM | POA: Diagnosis not present

## 2014-12-06 DIAGNOSIS — I10 Essential (primary) hypertension: Secondary | ICD-10-CM | POA: Insufficient documentation

## 2014-12-06 DIAGNOSIS — R197 Diarrhea, unspecified: Secondary | ICD-10-CM | POA: Insufficient documentation

## 2014-12-06 DIAGNOSIS — R112 Nausea with vomiting, unspecified: Secondary | ICD-10-CM | POA: Diagnosis present

## 2014-12-06 DIAGNOSIS — Z720 Tobacco use: Secondary | ICD-10-CM | POA: Insufficient documentation

## 2014-12-06 HISTORY — DX: Complete or unspecified spontaneous abortion without complication: O03.9

## 2014-12-06 LAB — URINALYSIS, ROUTINE W REFLEX MICROSCOPIC
Bilirubin Urine: NEGATIVE
GLUCOSE, UA: NEGATIVE mg/dL
KETONES UR: NEGATIVE mg/dL
Leukocytes, UA: NEGATIVE
NITRITE: NEGATIVE
PH: 6 (ref 5.0–8.0)
SPECIFIC GRAVITY, URINE: 1.02 (ref 1.005–1.030)
UROBILINOGEN UA: 0.2 mg/dL (ref 0.0–1.0)

## 2014-12-06 LAB — URINE MICROSCOPIC-ADD ON

## 2014-12-06 MED ORDER — SODIUM CHLORIDE 0.9 % IV BOLUS (SEPSIS)
1000.0000 mL | Freq: Once | INTRAVENOUS | Status: AC
  Administered 2014-12-06: 1000 mL via INTRAVENOUS

## 2014-12-06 MED ORDER — ONDANSETRON HCL 4 MG/2ML IJ SOLN
4.0000 mg | Freq: Once | INTRAMUSCULAR | Status: AC
  Administered 2014-12-06: 4 mg via INTRAVENOUS
  Filled 2014-12-06: qty 2

## 2014-12-06 MED ORDER — ONDANSETRON 4 MG PO TBDP: 4.0000 mg | ORAL_TABLET | Freq: Three times a day (TID) | ORAL | Status: DC | PRN

## 2014-12-06 MED ORDER — LOPERAMIDE HCL 2 MG PO CAPS: 2.0000 mg | ORAL_CAPSULE | Freq: Four times a day (QID) | ORAL | Status: DC | PRN

## 2014-12-06 MED ORDER — ONDANSETRON HCL 4 MG/2ML IJ SOLN: 4.0000 mg | Freq: Once | INTRAMUSCULAR | Status: DC

## 2014-12-06 NOTE — Discharge Instructions (Signed)

## 2014-12-06 NOTE — ED Provider Notes (Signed)
CSN: [id number]     Arrival date & time 12/06/14  [id number] History   First MD Initiated Contact with Patient 12/06/14 0354     Chief Complaint  Patient presents with  . Emesis     (Consider location/radiation/quality/duration/timing/severity/associated sxs/prior Treatment) HPI  This is a 34 year old female who presents with a one-day history of vomiting and diarrhea. Patient reports onset of symptoms yesterday. Has had known sick contacts. Reports nonbilious, nonbloody emesis and nonbloody diarrhea. Reports crampy abdominal pain. She reports that earlier this morning, she got up to go to the bathroom and she had an episode of lightheadedness while using the bathroom and passed out. Denies hitting her head. Denies any fevers. Patient reports recent miscarriage on March 14. She is being followed by the health department. Reports that her pregnancy tests have not turned negative yet but she is being followed closely. Reports ongoing vaginal bleeding which is unchanged.  Past Medical History  Diagnosis Date  . Hypertension   . Miscarriage    History reviewed. No pertinent past surgical history. No family history on file. History  Substance Use Topics  . Smoking status: Current Some Day Smoker    Types: Cigarettes  . Smokeless tobacco: Not on file  . Alcohol Use: Yes     Comment: occ   OB History    Gravida Para Term Preterm AB TAB SAB Ectopic Multiple Living            1     Review of Systems  Constitutional: Negative for fever.  Respiratory: Negative for cough, chest tightness and shortness of breath.   Cardiovascular: Negative for chest pain.  Gastrointestinal: Positive for nausea, vomiting, abdominal pain and diarrhea.  Genitourinary: Positive for vaginal bleeding. Negative for dysuria, vaginal discharge and pelvic pain.  Musculoskeletal: Negative for back pain.  Skin: Negative for wound.  Neurological: Negative for headaches.  Psychiatric/Behavioral: Negative for confusion.   All other systems reviewed and are negative.     Allergies  Review of patient's allergies indicates no known allergies.  Home Medications   Prior to Admission medications   Medication Sig Start Date End Date Taking? Authorizing Provider  hydrochlorothiazide (MICROZIDE) 12.5 MG capsule Take 12.5 mg by mouth daily.   Yes Historical Provider, MD  cephALEXin (KEFLEX) 500 MG capsule 2 caps po bid x 7 days Patient not taking: Reported on 10/31/2014 10/29/14   Doug SouSam Jacubowitz, MD  loperamide (IMODIUM) 2 MG capsule Take 1 capsule (2 mg total) by mouth 4 (four) times daily as needed for diarrhea or loose stools. 12/06/14   Shon Batonourtney F Zuriah Bordas, MD  metoprolol tartrate (LOPRESSOR) 25 MG tablet Take 25 mg by mouth 2 (two) times daily.    Historical Provider, MD  norethindrone (JENCYCLA) 0.35 MG tablet Take 1 tablet by mouth daily.    Historical Provider, MD  ondansetron (ZOFRAN ODT) 4 MG disintegrating tablet Take 1 tablet (4 mg total) by mouth every 8 (eight) hours as needed for nausea or vomiting. 12/06/14   Shon Batonourtney F Hiromi Knodel, MD  Prenatal Vit-Fe Fumarate-FA (NAT-RUL PRENATAL VITAMINS) 28-0.8 MG TABS Take 1 tablet by mouth every morning. Patient not taking: Reported on 10/31/2014 10/29/14   Doug SouSam Jacubowitz, MD   BP 128/88 mmHg  Pulse 93  Temp(Src) 98.7 F (37.1 C) (Oral)  Resp 16  Ht 5\' 6"  (1.676 m)  Wt 223 lb (101.152 kg)  BMI 36.01 kg/m2  SpO2 100% Physical Exam  Constitutional: She is oriented to person, place, and time. She appears well-developed and well-nourished.  No distress.  HENT:  Head: Normocephalic and atraumatic.  Cardiovascular: Normal rate, regular rhythm and normal heart sounds.   No murmur heard. Pulmonary/Chest: Effort normal and breath sounds normal. No respiratory distress. She has no wheezes.  Abdominal: Soft. Bowel sounds are normal. There is tenderness. There is no rebound and no guarding.  Mild tenderness to palpation of the epigastrium without rebound or guarding   Musculoskeletal: She exhibits no edema.  Neurological: She is alert and oriented to person, place, and time.  Skin: Skin is warm and dry.  Psychiatric: She has a normal mood and affect.  Nursing note and vitals reviewed.   ED Course  Procedures (including critical care time) Labs Review Labs Reviewed  URINALYSIS, ROUTINE W REFLEX MICROSCOPIC - Abnormal; Notable for the following:    Hgb urine dipstick MODERATE (*)    Protein, ur TRACE (*)    All other components within normal limits  URINE MICROSCOPIC-ADD ON - Abnormal; Notable for the following:    Squamous Epithelial / LPF MANY (*)    Bacteria, UA MANY (*)    All other components within normal limits    Imaging Review No results found.   EKG Interpretation None      MDM   Final diagnoses:  Nausea vomiting and diarrhea    Patient presents with nausea, vomiting, and diarrhea. Reports sick contacts. Also reports syncopal episode. This was related to getting up to go to the bathroom. Patient's heart rate did increase from 80-100 upon standing. Likely orthostatic and dehydrated. Patient given normal saline bolus, Zofran for symptom management. Urinalysis with out evidence of significant ketones.  On recheck, patient reports improvement of symptoms. Able to tolerate liquids by mouth. Discussed with patient supportive management home with Zofran and Imodium. Patient stated understanding.  After history, exam, and medical workup I feel the patient has been appropriately medically screened and is safe for discharge home. Pertinent diagnoses were discussed with the patient. Patient was given return precautions.     Shon Baton, MD 12/06/14 [phone]

## 2014-12-06 NOTE — ED Notes (Signed)
Pt alert & oriented x4, stable gait. Patient given discharge instructions, paperwork & prescription(s). Patient  instructed to stop at the registration desk to finish any additional paperwork. Patient verbalized understanding. Pt left department w/ no further questions. 

## 2014-12-06 NOTE — ED Notes (Signed)
Pt reports vomiting & diarrhea that started yesterday. Tonight pt was having a bowel movement & vomiting became light headed & blacked out. Pt states was blacked out for a few seconds.

## 2014-12-06 NOTE — ED Notes (Signed)
Pt given drink at this time. No vomiting since arriving in the ED.

## 2015-01-18 ENCOUNTER — Encounter (HOSPITAL_COMMUNITY): Payer: Self-pay | Admitting: Emergency Medicine

## 2015-01-18 ENCOUNTER — Emergency Department (HOSPITAL_COMMUNITY)
Admission: EM | Admit: 2015-01-18 | Discharge: 2015-01-18 | Disposition: A | Discharge status: Home / Self Care | Payer: Medicaid Other | Attending: Emergency Medicine | Admitting: Emergency Medicine

## 2015-01-18 DIAGNOSIS — F419 Anxiety disorder, unspecified: Secondary | ICD-10-CM

## 2015-01-18 DIAGNOSIS — Z720 Tobacco use: Secondary | ICD-10-CM | POA: Insufficient documentation

## 2015-01-18 DIAGNOSIS — Z79899 Other long term (current) drug therapy: Secondary | ICD-10-CM | POA: Insufficient documentation

## 2015-01-18 DIAGNOSIS — Z3202 Encounter for pregnancy test, result negative: Secondary | ICD-10-CM | POA: Insufficient documentation

## 2015-01-18 DIAGNOSIS — I10 Essential (primary) hypertension: Secondary | ICD-10-CM | POA: Insufficient documentation

## 2015-01-18 LAB — BASIC METABOLIC PANEL
ANION GAP: 7 (ref 5–15)
BUN: 10 mg/dL (ref 6–20)
CHLORIDE: 105 mmol/L (ref 101–111)
CO2: 27 mmol/L (ref 22–32)
Calcium: 8.7 mg/dL — ABNORMAL LOW (ref 8.9–10.3)
Creatinine, Ser: 0.73 mg/dL (ref 0.44–1.00)
GFR calc Af Amer: >60 mL/min (ref >60)
GFR calc non Af Amer: >60 mL/min (ref >60)
GLUCOSE: 86 mg/dL (ref 65–99)
Potassium: 3.5 mmol/L (ref 3.5–5.1)
SODIUM: 139 mmol/L (ref 135–145)

## 2015-01-18 LAB — URINALYSIS, ROUTINE W REFLEX MICROSCOPIC
BILIRUBIN URINE: NEGATIVE
Glucose, UA: NEGATIVE mg/dL
HGB URINE DIPSTICK: NEGATIVE
Ketones, ur: NEGATIVE mg/dL
Leukocytes, UA: NEGATIVE
Nitrite: NEGATIVE
Protein, ur: NEGATIVE mg/dL
SPECIFIC GRAVITY, URINE: 1.025 (ref 1.005–1.030)
Urobilinogen, UA: 0.2 mg/dL (ref 0.0–1.0)
pH: 6.5 (ref 5.0–8.0)

## 2015-01-18 LAB — POC URINE PREG, ED: PREG TEST UR: NEGATIVE

## 2015-01-18 MED ORDER — LORAZEPAM 1 MG PO TABS: ORAL_TABLET | ORAL | Status: DC

## 2015-01-18 MED ORDER — LORAZEPAM 1 MG PO TABS: 1.0000 mg | ORAL_TABLET | Freq: Once | ORAL | Status: DC

## 2015-01-18 NOTE — ED Notes (Signed)
Before going into work pt felt heart racing, shaky, and felt light headed, calm at present.

## 2015-01-18 NOTE — ED Notes (Signed)
Lab at bedside for blood draw.

## 2015-01-18 NOTE — ED Provider Notes (Signed)
CSN: [id number]     Arrival date & time 01/18/15  1703 History   First MD Initiated Contact with Patient 01/18/15 1756     Chief Complaint  Patient presents with  . Anxiety     (Consider location/radiation/quality/duration/timing/severity/associated sxs/prior Treatment) HPI Comments: Patient is a 34 year old female who presents to the emergency department with a complaint of I think I have an anxiety attack.  The patient states that earlier today while at work she had a sudden onset of her heart racing, becoming shaky, feeling lightheaded, and generally not feeling well. She denies any chest pain, nausea, vomiting, having diarrhea, unusual rash, or any other related issue. She has not had any recent changes in medication, diet, or environment. She states she had a similar episode approximately 2 years ago, and then again approximately 3 months ago. She has not been evaluated for any of these episodes. Nothing seems to help them.  Patient is a 34 y.o. female presenting with anxiety. The history is provided by the patient.  Anxiety This is a recurrent problem. Pertinent negatives include no abdominal pain, arthralgias, chest pain, coughing or neck pain.    Past Medical History  Diagnosis Date  . Hypertension   . Miscarriage    History reviewed. No pertinent past surgical history. No family history on file. History  Substance Use Topics  . Smoking status: Current Some Day Smoker -- 1.00 packs/day    Types: Cigarettes  . Smokeless tobacco: Not on file  . Alcohol Use: Yes     Comment: occ   OB History    Gravida Para Term Preterm AB TAB SAB Ectopic Multiple Living            1     Review of Systems  Constitutional: Negative for activity change.       All ROS Neg except as noted in HPI  HENT: Negative for nosebleeds.   Eyes: Negative for photophobia and discharge.  Respiratory: Negative for cough, shortness of breath and wheezing.   Cardiovascular: Negative for chest pain and  palpitations.  Gastrointestinal: Negative for abdominal pain and blood in stool.  Genitourinary: Negative for dysuria, frequency and hematuria.  Musculoskeletal: Negative for back pain, arthralgias and neck pain.  Skin: Negative.   Neurological: Negative for dizziness, seizures and speech difficulty.  Psychiatric/Behavioral: Negative for hallucinations and confusion. The patient is nervous/anxious.       Allergies  Review of patient's allergies indicates no known allergies.  Home Medications   Prior to Admission medications   Medication Sig Start Date End Date Taking? Authorizing Provider  hydrochlorothiazide (MICROZIDE) 12.5 MG capsule Take 12.5 mg by mouth daily.   Yes Historical Provider, MD  norethindrone (JENCYCLA) 0.35 MG tablet Take 1 tablet by mouth daily.   Yes Historical Provider, MD  cephALEXin (KEFLEX) 500 MG capsule 2 caps po bid x 7 days Patient not taking: Reported on 10/31/2014 10/29/14   Doug SouSam Jacubowitz, MD  loperamide (IMODIUM) 2 MG capsule Take 1 capsule (2 mg total) by mouth 4 (four) times daily as needed for diarrhea or loose stools. Patient not taking: Reported on 01/18/2015 12/06/14   Shon Batonourtney F Horton, MD  ondansetron (ZOFRAN ODT) 4 MG disintegrating tablet Take 1 tablet (4 mg total) by mouth every 8 (eight) hours as needed for nausea or vomiting. Patient not taking: Reported on 01/18/2015 12/06/14   Shon Batonourtney F Horton, MD  Prenatal Vit-Fe Fumarate-FA (NAT-RUL PRENATAL VITAMINS) 28-0.8 MG TABS Take 1 tablet by mouth every morning. Patient not taking:  Reported on 10/31/2014 10/29/14   Doug Sou, MD   BP 137/78 mmHg  Pulse 68  Temp(Src) 98.3 F (36.8 C) (Oral)  Resp 16  Ht  (1.676 m)  Wt 225 lb (102.059 kg)  BMI 36.33 kg/m2  SpO2 100%  LMP 01/06/2015 Physical Exam  Constitutional: She is oriented to person, place, and time. She appears well-developed and well-nourished.  Non-toxic appearance.  HENT:  Head: Normocephalic.  Right Ear: Tympanic membrane and  external ear normal.  Left Ear: Tympanic membrane and external ear normal.  Eyes: EOM and lids are normal. Pupils are equal, round, and reactive to light.  Neck: Normal range of motion. Neck supple. Carotid bruit is not present.  Cardiovascular: Normal rate, regular rhythm, normal heart sounds, intact distal pulses and normal pulses.   Pulmonary/Chest: Breath sounds normal. No respiratory distress.  Abdominal: Soft. Bowel sounds are normal. There is no tenderness. There is no guarding.  Musculoskeletal: Normal range of motion.  Lymphadenopathy:       Head (right side): No submandibular adenopathy present.       Head (left side): No submandibular adenopathy present.    She has no cervical adenopathy.  Neurological: She is alert and oriented to person, place, and time. She has normal strength. No cranial nerve deficit or sensory deficit. She exhibits normal muscle tone. Coordination normal.  Skin: Skin is warm and dry.  Psychiatric: She has a normal mood and affect. Her speech is normal and behavior is normal. Judgment and thought content normal.  Nursing note and vitals reviewed.   ED Course  Procedures (including critical care time) Labs Review Labs Reviewed  BASIC METABOLIC PANEL - Abnormal; Notable for the following:    Calcium 8.7 (*)    All other components within normal limits  URINALYSIS, ROUTINE W REFLEX MICROSCOPIC (NOT AT [facility])  POC URINE PREG, ED    Imaging Review No results found.   EKG Interpretation None      MDM  Vital signs are well within normal limits. Pulse oximetry is 100% on room air. The patient states that she had what she thought was a possible panic attack earlier today at work, but she is calm during the time she is here in the emergency department. Electrocardiogram shows no acute event. Basic metabolic panel is well within normal limits. Urinalysis and urine pregnancy are all negative. There is no recurrence of panic or stress attack while in the  emergency department.  Discussed with patient the need to discuss these episodes with her primary physician and develop a plan for treatment. Patient is given a prescription for Ativan 1 mg every 6 hours as needed for anxiety until she is seen by her primary physician. The patient denies any suicidal or homicidal ideations. She is in agreement with this discharge plan.    Final diagnoses:  None    *I have reviewed nursing notes, vital signs, and all appropriate lab and imaging results for this patient.**    Ivery Quale, PA-C 01/18/15 2117  Bethann Berkshire, MD 01/19/15 1134

## 2015-01-18 NOTE — Discharge Instructions (Signed)
Your EKG is negative for any acute event. Your electrolytes are within normal limits. Your urine is negative for infection, and your pregnancy test is negative. Please discuss stress and anxiety with your primary physician. May use Ativan every 6 hours if needed until you're seen by your doctor. This medication may cause drowsiness, please do not drive a vehicle, operating machinery, drink alcohol, or participate in activities requiring concentration when taking this medication. Panic Attacks Panic attacks are sudden, short feelings of great fear or discomfort. You may have them for no reason when you are relaxed, when you are uneasy (anxious), or when you are sleeping.  HOME CARE  Take all your medicines as told.  Check with your doctor before starting new medicines.  Keep all doctor visits. GET HELP IF:  You are not able to take your medicines as told.  Your symptoms do not get better.  Your symptoms get worse. GET HELP RIGHT AWAY IF:  Your attacks seem different than your normal attacks.  You have thoughts about hurting yourself or others.  You take panic attack medicine and you have a side effect. MAKE SURE YOU:  Understand these instructions.  Will watch your condition.  Will get help right away if you are not doing well or get worse. Document Released: 09/07/2010 Document Revised: 05/26/2013 Document Reviewed: 03/19/2013 [facility] Patient Information 2015 [employer] [place]. This information is not intended to replace advice given to you by your health care provider. Make sure you discuss any questions you have with your health care provider.

## 2016-07-03 ENCOUNTER — Emergency Department (HOSPITAL_COMMUNITY)
Admission: EM | Admit: 2016-07-03 | Discharge: 2016-07-03 | Disposition: A | Discharge status: Home / Self Care | Payer: Medicaid Other | Attending: Emergency Medicine | Admitting: Emergency Medicine

## 2016-07-03 ENCOUNTER — Encounter (HOSPITAL_COMMUNITY): Payer: Self-pay

## 2016-07-03 DIAGNOSIS — F17210 Nicotine dependence, cigarettes, uncomplicated: Secondary | ICD-10-CM | POA: Insufficient documentation

## 2016-07-03 DIAGNOSIS — F419 Anxiety disorder, unspecified: Secondary | ICD-10-CM | POA: Insufficient documentation

## 2016-07-03 DIAGNOSIS — I10 Essential (primary) hypertension: Secondary | ICD-10-CM | POA: Insufficient documentation

## 2016-07-03 DIAGNOSIS — Z79899 Other long term (current) drug therapy: Secondary | ICD-10-CM | POA: Insufficient documentation

## 2016-07-03 DIAGNOSIS — F410 Panic disorder [episodic paroxysmal anxiety] without agoraphobia: Secondary | ICD-10-CM

## 2016-07-03 MED ORDER — LORAZEPAM 1 MG PO TABS: ORAL_TABLET | ORAL | 0 refills | Status: DC

## 2016-07-03 NOTE — Discharge Instructions (Signed)
Please read and follow all provided instructions.  Your diagnoses today include:  1. Panic attack    Tests performed today include: Vital signs. See below for your results today.   Medications prescribed:  Take as prescribed   Home care instructions:  Follow any educational materials contained in this packet.  Follow-up instructions: Please follow-up with your primary care provider for further evaluation of symptoms and treatment   Return instructions:  Please return to the Emergency Department if you do not get better, if you get worse, or new symptoms OR  - Fever (temperature greater than 101.46F)  - Bleeding that does not stop with holding pressure to the area    -Severe pain (please note that you may be more sore the day after your accident)  - Chest Pain  - Difficulty breathing  - Severe nausea or vomiting  - Inability to tolerate food and liquids  - Passing out  - Skin becoming red around your wounds  - Change in mental status (confusion or lethargy)  - New numbness or weakness    Please return if you have any other emergent concerns.  Additional Information:  Your vital signs today were: BP 167/86 (BP Location: Left Arm)    Pulse 92    Temp 98 F (36.7 C) (Oral)    Resp 16    Ht 5\' 7"  (1.702 m)    Wt 97.5 kg    LMP 06/02/2016    SpO2 100%    BMI 33.67 kg/m  If your blood pressure (BP) was elevated above 135/85 this visit, please have this repeated by your doctor within one month. ---------------

## 2016-07-03 NOTE — ED Triage Notes (Addendum)
Pt reports has been very stressed and reports has history of panic attacks.  Reports her hands started trembling and felt like she was going to hyperventilate and heart rate sped up.  Pt says had the same symptoms the last time she got anxious.

## 2016-07-03 NOTE — ED Provider Notes (Signed)
[facility]-EMERGENCY DEPT Provider Note   CSN: [id number] Arrival date & time: 07/03/16  1159   History   Chief Complaint Chief Complaint  Patient presents with  . Anxiety    HPI Tammy Rush is a 35 y.o. female.  HPI  35 y.o. female presents to the Emergency Department today due to anxiety attack. Notes hx of same with ER visit on 01-18-16. Pt states that she has been really stressed at work. Notes that suddenly her hands started trembling and felt like she was going to hyperventilate. Notes her HR increasing as well. No CP/SOB/ABD pain. No N/V/D. No rash. No recent changes in medications/diet. No fevers. No other symptoms noted  Past Medical History:  Diagnosis Date  . Hypertension   . Miscarriage     There are no active problems to display for this patient.   History reviewed. No pertinent surgical history.  OB History    Gravida Para Term Preterm AB Living             1   SAB TAB Ectopic Multiple Live Births                   Home Medications    Prior to Admission medications   Medication Sig Start Date End Date Taking? Authorizing Provider  cephALEXin (KEFLEX) 500 MG capsule 2 caps po bid x 7 days Patient not taking: Reported on 10/31/2014 10/29/14   Doug SouSam Jacubowitz, MD  hydrochlorothiazide (MICROZIDE) 12.5 MG capsule Take 12.5 mg by mouth daily.    Historical Provider, MD  loperamide (IMODIUM) 2 MG capsule Take 1 capsule (2 mg total) by mouth 4 (four) times daily as needed for diarrhea or loose stools. Patient not taking: Reported on 01/18/2015 12/06/14   Shon Batonourtney F Horton, MD  LORazepam (ATIVAN) 1 MG tablet 1 po q6h prn anxiety 01/18/15   Ivery QualeHobson Bryant, PA-C  norethindrone (JENCYCLA) 0.35 MG tablet Take 1 tablet by mouth daily.    Historical Provider, MD  ondansetron (ZOFRAN ODT) 4 MG disintegrating tablet Take 1 tablet (4 mg total) by mouth every 8 (eight) hours as needed for nausea or vomiting. Patient not taking: Reported on 01/18/2015 12/06/14   Shon Batonourtney F Horton, MD   Prenatal Vit-Fe Fumarate-FA (NAT-RUL PRENATAL VITAMINS) 28-0.8 MG TABS Take 1 tablet by mouth every morning. Patient not taking: Reported on 10/31/2014 10/29/14   Doug SouSam Jacubowitz, MD    Family History No family history on file.  Social History Social History  Substance Use Topics  . Smoking status: Current Some Day Smoker    Packs/day: 1.00    Types: Cigarettes  . Smokeless tobacco: Never Used  . Alcohol use No     Comment: occ     Allergies   Patient has no known allergies.   Review of Systems Review of Systems ROS reviewed and all are negative for acute change except as noted in the HPI.  Physical Exam Updated Vital Signs BP 167/86 (BP Location: Left Arm)   Pulse 92   Temp 98 F (36.7 C) (Oral)   Resp 16   Ht 5\' 7"  (1.702 m)   Wt 97.5 kg   LMP 06/02/2016   SpO2 100%   BMI 33.67 kg/m   Physical Exam  Constitutional: She is oriented to person, place, and time. Vital signs are normal. She appears well-developed and well-nourished.  HENT:  Head: Normocephalic.  Right Ear: Hearing normal.  Left Ear: Hearing normal.  Eyes: Conjunctivae and EOM are normal. Pupils are equal, round,  and reactive to light.  Neck: Normal range of motion.  Cardiovascular: Normal rate, regular rhythm, normal heart sounds and intact distal pulses.   Pulmonary/Chest: Effort normal and breath sounds normal.  Musculoskeletal: Normal range of motion.  Neurological: She is alert and oriented to person, place, and time.  Skin: Skin is warm and dry.  Psychiatric: She has a normal mood and affect. Her speech is normal and behavior is normal. Thought content normal.  Nursing note and vitals reviewed.  ED Treatments / Results  Labs (all labs ordered are listed, but only abnormal results are displayed) Labs Reviewed - No data to display  EKG  EKG Interpretation  Date/Time:  Wednesday July 03 2016 12:51:18 EST Ventricular Rate:  91 PR Interval:  160 QRS Duration: 72 QT  Interval:  342 QTC Calculation: 420 R Axis:   -10 Text Interpretation:  Normal sinus rhythm Low voltage QRS When compared with ECG of 01/18/2015 No significant change was found Confirmed by [facility] - Fort SmithMCMANUS  MD, Nicholos JohnsKATHLEEN [phone]([id number]) on 07/03/2016 12:57:55 PM      Radiology No results found.  Procedures Procedures (including critical care time)  Medications Ordered in ED Medications - No data to display   Initial Impression / Assessment and Plan / ED Course  I have reviewed the triage vital signs and the nursing notes.  Pertinent labs & imaging results that were available during my care of the patient were reviewed by me and considered in my medical decision making (see chart for details).  Clinical Course    Final Clinical Impressions(s) / ED Diagnoses  I have interpreted the relevant EKG. I obtained HPI from historian.  ED Course:  Assessment: Pt is a 35yF with hx anxiety attacks who presents with anxiety attack today. Hx same last year. No N/V/D. No fevers. On exam, pt in NAD. Nontoxic/nonseptic appearing. VSS. Afebrile. Lungs CTA. Heart RRR. Abdomen nontender soft. Calm in ED. EKG unremarkable. No reoccurrence of panic attack in ED. Discussed follow up with PCP for treatment plan. Given Rx Ativan 1mg  PRN #5.  Plan is to DC Home. At time of discharge, Patient is in no acute distress. Vital Signs are stable. Patient is able to ambulate. Patient able to tolerate PO.   Disposition/Plan:  DC Home Additional Verbal discharge instructions given and discussed with patient.  Pt Instructed to f/u with PCP in the next week for evaluation and treatment of symptoms. Return precautions given Pt acknowledges and agrees with plan  Supervising Physician Samuel JesterKathleen McManus, DO   Final diagnoses:  Panic attack    New Prescriptions New Prescriptions   No medications on file     Audry Piliyler [employer], PA-C 07/03/16 1327    Samuel JesterKathleen McManus, DO 07/03/16 1622

## 2017-11-12 ENCOUNTER — Emergency Department (HOSPITAL_COMMUNITY)
Admission: EM | Admit: 2017-11-12 | Discharge: 2017-11-12 | Disposition: A | Discharge status: Home / Self Care | Payer: Self-pay | Attending: Emergency Medicine | Admitting: Emergency Medicine

## 2017-11-12 ENCOUNTER — Encounter (HOSPITAL_COMMUNITY): Payer: Self-pay | Admitting: Emergency Medicine

## 2017-11-12 ENCOUNTER — Other Ambulatory Visit: Payer: Self-pay

## 2017-11-12 DIAGNOSIS — Z79899 Other long term (current) drug therapy: Secondary | ICD-10-CM | POA: Insufficient documentation

## 2017-11-12 DIAGNOSIS — I10 Essential (primary) hypertension: Secondary | ICD-10-CM | POA: Insufficient documentation

## 2017-11-12 DIAGNOSIS — Z87891 Personal history of nicotine dependence: Secondary | ICD-10-CM | POA: Insufficient documentation

## 2017-11-12 NOTE — Discharge Instructions (Addendum)
Increase your metoprolol to 2 tablets (50 milligrams) twice daily starting with tonight's dose.  Check your blood pressure daily.  Write down the value each day for the next week . call your doctor at the health department to arrange to get your blood pressure rechecked in the office next week.  Take your log of blood pressures for the past week with you to the office visit.  Watch for lightheadedness standing, as a result of changing her medication today.  If you become lightheaded, go back to the original dose of 1 tablet  (25 miiligrams)twice daily.  Today's blood pressure was elevated at 65/100

## 2017-11-12 NOTE — ED Triage Notes (Signed)
PT states she went to get a tooth removed this morning @ the [facility] and was told her b/p was too high today and they walked her to the health dept where she is seen and they told her to come to ED for eval. PT takes Metoprolol 25mg  BID for htn. PT states recent headache but also has recently finished antibiotic for abscess tooth.

## 2017-11-12 NOTE — ED Provider Notes (Signed)
[facility]   CSN: [id number] Arrival date & time: 11/12/17  1024     History   Chief Complaint Chief Complaint  Patient presents with  . Hypertension    HPI Tammy Rush is a 37 y.o. female.  HPI She was sent here from health department.  She went to the dentist today to get a tooth pulled her blood pressure was noted to be elevated.  Maurine MinisterDennis was uncomfortable working on her teeth so he sent her to her primary care physician which is local health department.  Blood pressure noted to be elevated to health department so she was sent here.  Patient is completely asymptomatic.  Denies any headache her tooth does not hurt at present she denies chest pain shortness of breath abdominal pain.  No treatment prior to coming here.  Her blood pressure medication was changed from metoprolol 25 mg daily to metoprolol 25 mg twice daily at the end of January 2019. Past Medical History:  Diagnosis Date  . Hypertension   . Miscarriage     There are no active problems to display for this patient.   History reviewed. No pertinent surgical history.   OB History    Gravida      Para      Term      Preterm      AB      Living  1     SAB      TAB      Ectopic      Multiple      Live Births               Home Medications    Prior to Admission medications   Medication Sig Start Date End Date Taking? Authorizing Provider  cephALEXin (KEFLEX) 500 MG capsule 2 caps po bid x 7 days Patient not taking: Reported on 10/31/2014 10/29/14   Doug SouJacubowitz, Revin Corker, MD  hydrochlorothiazide (MICROZIDE) 12.5 MG capsule Take 12.5 mg by mouth daily.    [provider]  loperamide (IMODIUM) 2 MG capsule Take 1 capsule (2 mg total) by mouth 4 (four) times daily as needed for diarrhea or loose stools. Patient not taking: Reported on 01/18/2015 12/06/14   Horton, Mayer Maskerourtney F, MD  LORazepam (ATIVAN) 1 MG tablet 1 po q6h prn anxiety 07/03/16   Audry PiliMohr, Tyler,  PA-C  norethindrone (JENCYCLA) 0.35 MG tablet Take 1 tablet by mouth daily.    [provider]  ondansetron (ZOFRAN ODT) 4 MG disintegrating tablet Take 1 tablet (4 mg total) by mouth every 8 (eight) hours as needed for nausea or vomiting. Patient not taking: Reported on 01/18/2015 12/06/14   Horton, Mayer Maskerourtney F, MD  Prenatal Vit-Fe Fumarate-FA (NAT-RUL PRENATAL VITAMINS) 28-0.8 MG TABS Take 1 tablet by mouth every morning. Patient not taking: Reported on 10/31/2014 10/29/14   Doug SouJacubowitz, Ilo Beamon, MD    Family History History reviewed. No pertinent family history.  Social History Social History   Tobacco Use  . Smoking status: Former Smoker    Packs/day: 1.00    Types: Cigarettes    Last attempt to quit: 09/03/2017    Years since quitting: 0.1  . Smokeless tobacco: Never Used  Substance Use Topics  . Alcohol use: No    Comment: occ  . Drug use: No     Allergies   Patient has no known allergies.   Review of Systems Review of Systems  Constitutional: Negative.   HENT: Positive for dental problem.  Respiratory: Negative.   Cardiovascular: Negative.   Gastrointestinal: Negative.   Musculoskeletal: Negative.   Skin: Negative.   Neurological: Negative.   Psychiatric/Behavioral: Negative.   All other systems reviewed and are negative.    Physical Exam Updated Vital Signs BP (!) 165/100 (BP Location: Right Arm)   Pulse 65   Temp 97.7 F (36.5 C) (Oral)   Resp 18   Ht 5\' 6"  (1.676 m)   Wt 97.5 kg (215 lb)   LMP 11/05/2017   SpO2 100%   BMI 34.70 kg/m   Physical Exam  Constitutional: She appears well-developed and well-nourished.  HENT:  Head: Normocephalic and atraumatic.  Teeth are nontender.  No trismus.  No swelling or fluctuance of gingiva  Eyes: Pupils are equal, round, and reactive to light. Conjunctivae are normal.  Neck: Neck supple. No tracheal deviation present. No thyromegaly present.  Cardiovascular: Normal rate and regular rhythm.  No murmur  heard. Pulmonary/Chest: Effort normal and breath sounds normal.  Abdominal: Soft. Bowel sounds are normal. She exhibits no distension. There is no tenderness.  Obese  Musculoskeletal: Normal range of motion. She exhibits no edema or tenderness.  Neurological: She is alert. Coordination normal.  Skin: Skin is warm and dry. No rash noted.  Psychiatric: She has a normal mood and affect.  Nursing note and vitals reviewed.    ED Treatments / Results  Labs (all labs ordered are listed, but only abnormal results are displayed) Labs Reviewed - No data to display  EKG None  Radiology No results found.  Procedures Procedures (including critical care time)  Medications Ordered in ED Medications - No data to display   Initial Impression / Assessment and Plan / ED Course  I have reviewed the triage vital signs and the nursing notes.  Pertinent labs & imaging results that were available during my care of the patient were reviewed by me and considered in my medical decision making (see chart for details).   No signs of hypertensive emergency.  No signs of endorgan damage and patient is asymptomatic  Plan increase dose of metoprolol 50 mg twice daily.  Blood pressure recheck 1 week  Final Clinical Impressions(s) / ED Diagnoses   Final diagnoses:  Essential hypertension    ED Discharge Orders    None       Doug Sou, MD 11/12/17 1121

## 2017-11-12 NOTE — ED Notes (Signed)
ED Provider at bedside. 

## 2020-01-16 ENCOUNTER — Emergency Department (HOSPITAL_COMMUNITY)
Admission: EM | Admit: 2020-01-16 | Discharge: 2020-01-16 | Disposition: A | Discharge status: Home / Self Care | Payer: Self-pay | Attending: Emergency Medicine | Admitting: Emergency Medicine

## 2020-01-16 ENCOUNTER — Other Ambulatory Visit: Payer: Self-pay

## 2020-01-16 ENCOUNTER — Encounter (HOSPITAL_COMMUNITY): Payer: Self-pay | Admitting: Emergency Medicine

## 2020-01-16 DIAGNOSIS — I10 Essential (primary) hypertension: Secondary | ICD-10-CM | POA: Insufficient documentation

## 2020-01-16 DIAGNOSIS — R112 Nausea with vomiting, unspecified: Secondary | ICD-10-CM | POA: Insufficient documentation

## 2020-01-16 DIAGNOSIS — Z79899 Other long term (current) drug therapy: Secondary | ICD-10-CM | POA: Insufficient documentation

## 2020-01-16 DIAGNOSIS — R42 Dizziness and giddiness: Secondary | ICD-10-CM | POA: Insufficient documentation

## 2020-01-16 DIAGNOSIS — Z87891 Personal history of nicotine dependence: Secondary | ICD-10-CM | POA: Insufficient documentation

## 2020-01-16 LAB — COMPREHENSIVE METABOLIC PANEL
ALT: 18 U/L (ref 0–44)
AST: 15 U/L (ref 15–41)
Albumin: 3.9 g/dL (ref 3.5–5.0)
Alkaline Phosphatase: 44 U/L (ref 38–126)
Anion gap: 11 (ref 5–15)
BUN: 10 mg/dL (ref 6–20)
CO2: 26 mmol/L (ref 22–32)
Calcium: 9.1 mg/dL (ref 8.9–10.3)
Chloride: 102 mmol/L (ref 98–111)
Creatinine, Ser: 0.67 mg/dL (ref 0.44–1.00)
GFR calc Af Amer: >60 mL/min (ref >60)
GFR calc non Af Amer: >60 mL/min (ref >60)
Glucose, Bld: 98 mg/dL (ref 70–99)
Potassium: 3.2 mmol/L — ABNORMAL LOW (ref 3.5–5.1)
Sodium: 139 mmol/L (ref 135–145)
Total Bilirubin: 0.4 mg/dL (ref 0.3–1.2)
Total Protein: 7.6 g/dL (ref 6.5–8.1)

## 2020-01-16 LAB — CBC WITH DIFFERENTIAL/PLATELET
Abs Immature Granulocytes: 0.02 10*3/uL (ref 0.00–0.07)
Basophils Absolute: 0 10*3/uL (ref 0.0–0.1)
Basophils Relative: 0 %
Eosinophils Absolute: 0.1 10*3/uL (ref 0.0–0.5)
Eosinophils Relative: 1 %
HCT: 40.7 % (ref 36.0–46.0)
Hemoglobin: 12.3 g/dL (ref 12.0–15.0)
Immature Granulocytes: 0 %
Lymphocytes Relative: 20 %
Lymphs Abs: 1.4 10*3/uL (ref 0.7–4.0)
MCH: 23.2 pg — ABNORMAL LOW (ref 26.0–34.0)
MCHC: 30.2 g/dL (ref 30.0–36.0)
MCV: 76.6 fL — ABNORMAL LOW (ref 80.0–100.0)
Monocytes Absolute: 0.5 10*3/uL (ref 0.1–1.0)
Monocytes Relative: 7 %
Neutro Abs: 5.1 10*3/uL (ref 1.7–7.7)
Neutrophils Relative %: 72 %
Platelets: 440 10*3/uL — ABNORMAL HIGH (ref 150–400)
RBC: 5.31 MIL/uL — ABNORMAL HIGH (ref 3.87–5.11)
RDW: 15.2 % (ref 11.5–15.5)
WBC: 7.1 10*3/uL (ref 4.0–10.5)
nRBC: 0 % (ref 0.0–0.2)

## 2020-01-16 LAB — URINALYSIS, ROUTINE W REFLEX MICROSCOPIC
Bilirubin Urine: NEGATIVE
Glucose, UA: NEGATIVE mg/dL
Hgb urine dipstick: NEGATIVE
Ketones, ur: 5 mg/dL — AB
Leukocytes,Ua: NEGATIVE
Nitrite: NEGATIVE
Protein, ur: NEGATIVE mg/dL
Specific Gravity, Urine: 1.023 (ref 1.005–1.030)
pH: 6 (ref 5.0–8.0)

## 2020-01-16 LAB — PREGNANCY, URINE: Preg Test, Ur: NEGATIVE

## 2020-01-16 MED ORDER — ONDANSETRON HCL 4 MG/2ML IJ SOLN
4.0000 mg | Freq: Once | INTRAMUSCULAR | Status: AC
  Administered 2020-01-16: 4 mg via INTRAVENOUS
  Filled 2020-01-16: qty 2

## 2020-01-16 MED ORDER — MECLIZINE HCL 25 MG PO TABS: 25.0000 mg | ORAL_TABLET | Freq: Three times a day (TID) | ORAL | 0 refills | Status: DC | PRN

## 2020-01-16 MED ORDER — SODIUM CHLORIDE 0.9 % IV BOLUS
1000.0000 mL | Freq: Once | INTRAVENOUS | Status: AC
  Administered 2020-01-16: 1000 mL via INTRAVENOUS

## 2020-01-16 MED ORDER — MECLIZINE HCL 12.5 MG PO TABS
25.0000 mg | ORAL_TABLET | Freq: Once | ORAL | Status: AC
  Administered 2020-01-16: 25 mg via ORAL
  Filled 2020-01-16: qty 2

## 2020-01-16 NOTE — ED Provider Notes (Signed)
[facility] EMERGENCY DEPARTMENT Provider Note   CSN: [id number] Arrival date & time: 01/16/20  1524     History Chief Complaint  Patient presents with  . Emesis    Tammy Rush is a 39 y.o. female.  Patient is a 39 year old female with history of hypertension.  She presents with complaints of dizziness.  Patient describes a spinning sensation that has been occurring intermittently for the past 5 years.  The symptom returned this morning and is causing her to feel nauseated and she has vomited twice.  Her symptoms are worse when she moves and turns her head and relieved somewhat with rest.  She denies any ringing in her ears or hearing loss.  She denies any headache, fevers, or chills.  She has been seen in the emergency department in the past, but has never seen her primary doctor for this complaint.  The history is provided by the patient.       Past Medical History:  Diagnosis Date  . Hypertension   . Miscarriage     There are no problems to display for this patient.   History reviewed. No pertinent surgical history.   OB History    Gravida      Para      Term      Preterm      AB      Living  1     SAB      TAB      Ectopic      Multiple      Live Births              History reviewed. No pertinent family history.  Social History   Tobacco Use  . Smoking status: Former Smoker    Packs/day: 1.00    Types: Cigarettes    Quit date: 09/03/2017    Years since quitting: 2.3  . Smokeless tobacco: Never Used  Substance Use Topics  . Alcohol use: No    Comment: occ  . Drug use: No    Home Medications Prior to Admission medications   Medication Sig Start Date End Date Taking? Authorizing Provider  cephALEXin (KEFLEX) 500 MG capsule 2 caps po bid x 7 days Patient not taking: Reported on 10/31/2014 10/29/14   Doug Sou, MD  hydrochlorothiazide (MICROZIDE) 12.5 MG capsule Take 12.5 mg by mouth daily.    [provider]    loperamide (IMODIUM) 2 MG capsule Take 1 capsule (2 mg total) by mouth 4 (four) times daily as needed for diarrhea or loose stools. Patient not taking: Reported on 01/18/2015 12/06/14   Horton, Mayer Masker, MD  LORazepam (ATIVAN) 1 MG tablet 1 po q6h prn anxiety 07/03/16   Audry Pili, PA-C  norethindrone (JENCYCLA) 0.35 MG tablet Take 1 tablet by mouth daily.    [provider]  ondansetron (ZOFRAN ODT) 4 MG disintegrating tablet Take 1 tablet (4 mg total) by mouth every 8 (eight) hours as needed for nausea or vomiting. Patient not taking: Reported on 01/18/2015 12/06/14   Horton, Mayer Masker, MD  Prenatal Vit-Fe Fumarate-FA (NAT-RUL PRENATAL VITAMINS) 28-0.8 MG TABS Take 1 tablet by mouth every morning. Patient not taking: Reported on 10/31/2014 10/29/14   Doug Sou, MD    Allergies    Patient has no known allergies.  Review of Systems   Review of Systems  All other systems reviewed and are negative.   Physical Exam Updated Vital Signs BP 135/75 (BP Location: Right Arm)   Pulse  68   Temp 98.5 F (36.9 C) (Oral)   Resp 18   Ht 5\' 6"  (1.676 m)   Wt 104.8 kg   LMP 01/09/2020   SpO2 100%   BMI 37.28 kg/m   Physical Exam Vitals and nursing note reviewed.  Constitutional:      General: She is not in acute distress.    Appearance: She is well-developed. She is not diaphoretic.  HENT:     Head: Normocephalic and atraumatic.     Right Ear: Tympanic membrane and ear canal normal.     Left Ear: Tympanic membrane and ear canal normal.     Ears:     Comments: TMs are clear bilaterally. Eyes:     Extraocular Movements: Extraocular movements intact.     Pupils: Pupils are equal, round, and reactive to light.  Cardiovascular:     Rate and Rhythm: Normal rate and regular rhythm.     Heart sounds: No murmur. No friction rub. No gallop.   Pulmonary:     Effort: Pulmonary effort is normal. No respiratory distress.     Breath sounds: Normal breath sounds. No wheezing.   Abdominal:     General: Bowel sounds are normal. There is no distension.     Palpations: Abdomen is soft.     Tenderness: There is no abdominal tenderness.  Musculoskeletal:        General: Normal range of motion.     Cervical back: Normal range of motion and neck supple.  Skin:    General: Skin is warm and dry.  Neurological:     General: No focal deficit present.     Mental Status: She is alert and oriented to person, place, and time.     Cranial Nerves: No cranial nerve deficit.     Motor: No weakness.     Coordination: Coordination normal.     ED Results / Procedures / Treatments   Labs (all labs ordered are listed, but only abnormal results are displayed) Labs Reviewed  CBC WITH DIFFERENTIAL/PLATELET  COMPREHENSIVE METABOLIC PANEL  URINALYSIS, ROUTINE W REFLEX MICROSCOPIC  PREGNANCY, URINE    EKG None  Radiology No results found.  Procedures Procedures (including critical care time)  Medications Ordered in ED Medications  sodium chloride 0.9 % bolus 1,000 mL (has no administration in time range)  ondansetron (ZOFRAN) injection 4 mg (has no administration in time range)  meclizine (ANTIVERT) tablet 25 mg (has no administration in time range)    ED Course  I have reviewed the triage vital signs and the nursing notes.  Pertinent labs & imaging results that were available during my care of the patient were reviewed by me and considered in my medical decision making (see chart for details).    MDM Rules/Calculators/A&P  Patient is a 39 year old female presenting with complaints of dizziness that has occurred intermittently over the past 5 years.  She describes a spinning sensation that occurs when she changes position or turns her head.  This sounds very much like peripheral vertigo.  She is feeling better after receiving meclizine, IV fluids, and Zofran.  She is neurologically intact and at this point I see no indication for further work-up.  Patient will be  prescribed meclizine and advised to follow-up with her primary doctor if symptoms persist.  Final Clinical Impression(s) / ED Diagnoses Final diagnoses:  None    Rx / DC Orders ED Discharge Orders    None       Veryl Speak, MD 01/16/20  1700  

## 2020-01-16 NOTE — Discharge Instructions (Addendum)
Take meclizine as prescribed as needed for dizziness.  Follow-up with your primary doctor if symptoms persist to discuss referral to ear, nose, and throat.  Return to the emergency department if symptoms significantly worsen or change.

## 2020-01-16 NOTE — ED Triage Notes (Addendum)
Pt c/o n/v since yesterday with dizziness, denies abd pain, fever, diarrhea

## 2020-01-16 NOTE — ED Notes (Signed)
Pt reports dizziness with Vomiting after she gets dizzy   She reports V x 2 today   Reports her mother has vertigo   Ortho VS  Lying: 158/92 85,16,98 per cent   Sitting:  140/82, 76, 18 100 per cent   Standing : 134/84, 75, 18, 100 per cent

## 2020-06-05 ENCOUNTER — Other Ambulatory Visit (HOSPITAL_COMMUNITY): Payer: Self-pay | Admitting: Nurse Practitioner

## 2020-06-05 DIAGNOSIS — Z1231 Encounter for screening mammogram for malignant neoplasm of breast: Secondary | ICD-10-CM

## 2020-06-26 ENCOUNTER — Ambulatory Visit (HOSPITAL_COMMUNITY)
Admission: RE | Admit: 2020-06-26 | Discharge: 2020-06-26 | Disposition: A | Discharge status: Home / Self Care | Payer: Self-pay | Source: Ambulatory Visit | Attending: Nurse Practitioner | Admitting: Nurse Practitioner

## 2020-06-26 DIAGNOSIS — Z1231 Encounter for screening mammogram for malignant neoplasm of breast: Secondary | ICD-10-CM | POA: Insufficient documentation

## 2021-07-26 ENCOUNTER — Other Ambulatory Visit (HOSPITAL_COMMUNITY): Payer: Self-pay | Admitting: Nurse Practitioner

## 2021-07-26 DIAGNOSIS — Z3041 Encounter for surveillance of contraceptive pills: Secondary | ICD-10-CM | POA: Diagnosis not present

## 2021-07-26 DIAGNOSIS — Z1231 Encounter for screening mammogram for malignant neoplasm of breast: Secondary | ICD-10-CM

## 2021-07-26 DIAGNOSIS — Z01419 Encounter for gynecological examination (general) (routine) without abnormal findings: Secondary | ICD-10-CM | POA: Diagnosis not present

## 2021-07-26 DIAGNOSIS — L309 Dermatitis, unspecified: Secondary | ICD-10-CM | POA: Diagnosis not present

## 2021-08-02 ENCOUNTER — Ambulatory Visit (HOSPITAL_COMMUNITY)
Admission: RE | Admit: 2021-08-02 | Discharge: 2021-08-02 | Disposition: A | Discharge status: Home / Self Care | Payer: Self-pay | Source: Ambulatory Visit | Attending: Nurse Practitioner | Admitting: Nurse Practitioner

## 2021-08-02 ENCOUNTER — Other Ambulatory Visit: Payer: Self-pay

## 2021-08-02 DIAGNOSIS — Z1231 Encounter for screening mammogram for malignant neoplasm of breast: Secondary | ICD-10-CM | POA: Insufficient documentation

## 2021-08-07 ENCOUNTER — Other Ambulatory Visit: Payer: Self-pay

## 2021-08-07 DIAGNOSIS — N6320 Unspecified lump in the left breast, unspecified quadrant: Secondary | ICD-10-CM

## 2021-08-09 ENCOUNTER — Telehealth: Payer: Self-pay

## 2021-08-09 NOTE — Telephone Encounter (Signed)
Telephoned patient at home number. Explained to patient received referral to schedule appointment. She explained she had her mammogram at [facility].

## 2021-08-15 NOTE — Telephone Encounter (Signed)
Telephoned patient at [phone]. Patient did not answer and does not have a voice message set up.

## 2021-08-24 ENCOUNTER — Ambulatory Visit (HOSPITAL_COMMUNITY): Payer: Medicaid Other

## 2021-09-07 ENCOUNTER — Inpatient Hospital Stay (HOSPITAL_COMMUNITY): Payer: Medicaid Other | Attending: Obstetrics and Gynecology | Admitting: *Deleted

## 2021-09-07 ENCOUNTER — Other Ambulatory Visit: Payer: Self-pay

## 2021-09-07 ENCOUNTER — Encounter (HOSPITAL_COMMUNITY): Payer: Self-pay

## 2021-09-07 VITALS — BP 129/70 | Wt 232.8 lb

## 2021-09-07 DIAGNOSIS — Z1239 Encounter for other screening for malignant neoplasm of breast: Secondary | ICD-10-CM

## 2021-09-07 NOTE — Progress Notes (Signed)
Tammy Rush is a 41 y.o. female who presents to [facility] clinic today with no complaints. Patient referred to [facility] Mammography due to having a screening mammogram completed 08/02/2021 that additional imaging of the left breast is recommended for follow-up.   Pap Smear: Pap smear not completed today. Last Pap smear was 06/05/2020 at the [facility] Department clinic and was normal. Per patient has history of an abnormal Pap smear 10 years ago that a colposcopy was completed for follow-up and 05/21/2018 that was ASCUS with positive HPV that no follow-up was completed. Patient has had two normal Pap smears since abnormal 05/21/2018. Last Pap smear result is not available in Epic. Last Pap smear result will be scanned into Epic.   Physical exam: Breasts Breasts symmetrical. No skin abnormalities bilateral breasts. No nipple retraction bilateral breasts. No nipple discharge bilateral breasts. No lymphadenopathy. No lumps palpated bilateral breasts. No complaints of pain or tenderness on exam.  MS DIGITAL SCREENING TOMO BILATERAL  Result Date: 08/02/2021 CLINICAL DATA:  Screening. EXAM: DIGITAL SCREENING BILATERAL MAMMOGRAM WITH TOMOSYNTHESIS AND CAD TECHNIQUE: Bilateral screening digital craniocaudal and mediolateral oblique mammograms were obtained. Bilateral screening digital breast tomosynthesis was performed. The images were evaluated with computer-aided detection. COMPARISON:  Previous exams. ACR Breast Density Category b: There are scattered areas of fibroglandular density. FINDINGS: In the left breast, a possible mass warrants further evaluation. In the right breast, no findings suspicious for malignancy. IMPRESSION: Further evaluation is suggested for a possible mass in the left breast. RECOMMENDATION: Diagnostic mammogram and possibly ultrasound of the left breast. (Code:FI-L-39M) The patient will be contacted regarding the findings, and additional imaging will be scheduled.  BI-RADS CATEGORY  0: Incomplete. Need additional imaging evaluation and/or prior mammograms for comparison. Electronically Signed   By: Everlean Alstrom M.D.   On: 08/02/2021 11:47   MS DIGITAL SCREENING TOMO BILATERAL  Result Date: 06/27/2020 CLINICAL DATA:  Screening. EXAM: DIGITAL SCREENING BILATERAL MAMMOGRAM WITH TOMO AND CAD COMPARISON:  None. ACR Breast Density Category b: There are scattered areas of fibroglandular density. FINDINGS: There are no findings suspicious for malignancy. Images were processed with CAD. IMPRESSION: No mammographic evidence of malignancy. A result letter of this screening mammogram will be mailed directly to the patient. RECOMMENDATION: Screening mammogram in one year. (Code:SM-B-01Y) BI-RADS CATEGORY  1: Negative. Electronically Signed   By: Everlean Alstrom M.D.   On: 06/27/2020 12:39        Pelvic/Bimanual Pap is not indicated today per BCCCP guidelines.   Smoking History: Patient is a former smoker that quit 09/03/2017.   Patient Navigation: Patient education provided. Access to services provided for patient through B[facility]CP program.    Breast and Cervical Cancer Risk Assessment: Patient does not have family history of breast cancer, known genetic mutations, or radiation treatment to the chest before age 46. Per patient has history of cervical dysplasia. Patient has no history of being immunocompromised or DES exposure in-utero.  Risk Assessment     Risk Scores       09/07/2021   Last edited by: [name], [name]   5-year risk: 0.5 %   Lifetime risk: 7.4 %            A: B[facility]CP exam without pap smear No complaints.  P: Referred patient to [place] for a left breast diagnostic mammogram per recommendation. Appointment scheduled Tuesday, September 11, 2021 at 1120.  Tammy Parish, RN 09/07/2021 10:45 AM

## 2021-09-07 NOTE — Patient Instructions (Addendum)
Explained breast self awareness with [name]. Patient did not need a Pap smear today due to last Pap smear was 06/05/2020. Let her know B[facility]CP will cover Pap smears every 3 years unless has a history of abnormal Pap smears. Referred patient to [place] for a left breast diagnostic mammogram per recommendation. Appointment scheduled Tuesday, September 11, 2021 at 1120.Patient aware of appointment and will be there. [name] verbalized understanding.  Tammy Rush, Arvil Chaco, RN 10:45 AM

## 2021-09-11 ENCOUNTER — Ambulatory Visit (HOSPITAL_COMMUNITY)
Admission: RE | Admit: 2021-09-11 | Discharge: 2021-09-11 | Disposition: A | Discharge status: Home / Self Care | Payer: Medicaid Other | Source: Ambulatory Visit | Attending: Obstetrics and Gynecology | Admitting: Obstetrics and Gynecology

## 2021-09-11 ENCOUNTER — Other Ambulatory Visit: Payer: Self-pay

## 2021-09-11 ENCOUNTER — Ambulatory Visit (HOSPITAL_COMMUNITY)
Admission: RE | Admit: 2021-09-11 | Discharge: 2021-09-11 | Disposition: A | Discharge status: Home / Self Care | Payer: Self-pay | Source: Ambulatory Visit | Attending: Obstetrics and Gynecology | Admitting: Obstetrics and Gynecology

## 2021-09-11 DIAGNOSIS — N6320 Unspecified lump in the left breast, unspecified quadrant: Secondary | ICD-10-CM

## 2022-05-05 ENCOUNTER — Other Ambulatory Visit: Payer: Self-pay

## 2022-05-05 ENCOUNTER — Encounter (HOSPITAL_COMMUNITY): Payer: Self-pay

## 2022-05-05 ENCOUNTER — Emergency Department (HOSPITAL_COMMUNITY)
Admission: EM | Admit: 2022-05-05 | Discharge: 2022-05-05 | Disposition: A | Discharge status: Home / Self Care | Payer: Medicaid Other | Attending: Emergency Medicine | Admitting: Emergency Medicine

## 2022-05-05 DIAGNOSIS — B9789 Other viral agents as the cause of diseases classified elsewhere: Secondary | ICD-10-CM | POA: Insufficient documentation

## 2022-05-05 DIAGNOSIS — Z79899 Other long term (current) drug therapy: Secondary | ICD-10-CM | POA: Insufficient documentation

## 2022-05-05 DIAGNOSIS — J069 Acute upper respiratory infection, unspecified: Secondary | ICD-10-CM | POA: Insufficient documentation

## 2022-05-05 DIAGNOSIS — K0889 Other specified disorders of teeth and supporting structures: Secondary | ICD-10-CM | POA: Insufficient documentation

## 2022-05-05 DIAGNOSIS — U071 COVID-19: Secondary | ICD-10-CM | POA: Insufficient documentation

## 2022-05-05 LAB — SARS CORONAVIRUS 2 BY RT PCR: SARS Coronavirus 2 by RT PCR: POSITIVE — AB

## 2022-05-05 MED ORDER — IBUPROFEN 400 MG PO TABS
400.0000 mg | ORAL_TABLET | Freq: Once | ORAL | Status: AC
  Administered 2022-05-05: 400 mg via ORAL
  Filled 2022-05-05: qty 1

## 2022-05-05 MED ORDER — AMOXICILLIN 500 MG PO CAPS: 500.0000 mg | ORAL_CAPSULE | Freq: Two times a day (BID) | ORAL | 0 refills | Status: DC

## 2022-05-05 NOTE — ED Triage Notes (Signed)
Pov from home with several complaints.  Has dental pain on the bottom left that has caused problems swallowing. Started over a week ago

## 2022-05-05 NOTE — ED Provider Notes (Signed)
[facility] EMERGENCY DEPARTMENT Provider Note   CSN: [id number] Arrival date & time: 05/05/22  [id number]     History  Chief Complaint  Patient presents with   Dental Pain    Tammy Rush is a 41 y.o. female.  The history is provided by the patient.  Dental Pain Location:  Lower Quality:  Aching Severity:  Moderate Onset quality:  Gradual Timing:  Constant Progression:  Worsening Chronicity:  New Associated symptoms: fever   Patient presents for multiple complaints.  She reports swelling and pain over her left lower molar for the past several days.  She also reports fever, congestion, sore throat and cough.  She is concerned she has COVID.     Home Medications Prior to Admission medications   Medication Sig Start Date End Date Taking? Authorizing Provider  amoxicillin (AMOXIL) 500 MG capsule Take 1 capsule (500 mg total) by mouth 2 (two) times daily. 05/05/22  Yes Ripley Fraise, MD  cholecalciferol (VITAMIN D3) 25 MCG (1000 UNIT) tablet Take 1,000 Units by mouth daily.    [provider]  hydrochlorothiazide (MICROZIDE) 12.5 MG capsule Take 12.5 mg by mouth daily.    [provider]  lisinopril (ZESTRIL) 20 MG tablet Take 20 mg by mouth daily.    [provider]  LORazepam (ATIVAN) 1 MG tablet 1 po q6h prn anxiety 07/03/16   Shary Decamp, PA-C  meclizine (ANTIVERT) 25 MG tablet Take 1 tablet (25 mg total) by mouth 3 (three) times daily as needed for dizziness. 01/16/20   Veryl Speak, MD  norethindrone (MICRONOR) 0.35 MG tablet Take 1 tablet by mouth daily.    [provider]  ondansetron (ZOFRAN ODT) 4 MG disintegrating tablet Take 1 tablet (4 mg total) by mouth every 8 (eight) hours as needed for nausea or vomiting. Patient not taking: Reported on 01/18/2015 12/06/14   Horton, Barbette Hair, MD  Prenatal Vit-Fe Fumarate-FA (NAT-RUL PRENATAL VITAMINS) 28-0.8 MG TABS Take 1 tablet by mouth every morning. Patient not taking: Reported on  10/31/2014 10/29/14   Orlie Dakin, MD      Allergies    Patient has no known allergies.    Review of Systems   Review of Systems  Constitutional:  Positive for fever.  HENT:  Positive for dental problem and sore throat.   Respiratory:  Positive for cough.     Physical Exam Updated Vital Signs BP (!) 149/106   Pulse 83   Temp 98.4 F (36.9 C)   Resp 19   Ht 1.676 m (5\' 6" )   Wt 106 kg   SpO2 96%   BMI 37.72 kg/m  Physical Exam CONSTITUTIONAL: Well developed/well nourished HEAD: Normocephalic/atraumatic EYES: EOMI/PERRL ENMT: Mucous membranes moist, uvula midline, no erythema regular, no stridor or drooling No trismus.  Tenderness and swelling noted to left lower molar. No facial swelling NECK: supple no meningeal signs CV: S1/S2 noted, no murmurs/rubs/gallops noted LUNGS: Lungs are clear to auscultation bilaterally, no apparent distress ABDOMEN: soft NEURO: Pt is awake/alert/appropriate, moves all extremitiesx4.  No facial droop.   EXTREMITIES:  full ROM SKIN: warm, color normal PSYCH: no abnormalities of mood noted, alert and oriented to situation  ED Results / Procedures / Treatments   Labs (all labs ordered are listed, but only abnormal results are displayed) Labs Reviewed  SARS CORONAVIRUS 2 BY RT PCR    EKG None  Radiology No results found.  Procedures Procedures    Medications Ordered in ED Medications  ibuprofen (ADVIL) tablet 400 mg (  400 mg Oral Given 05/05/22 2505)    ED Course/ Medical Decision Making/ A&P                           Medical Decision Making Risk Prescription drug management.   Requesting COVID test.  Suspect viral URI. No distress, no hypoxia, lung sounds are clear, low suspicion for pneumonia  As for her dental pain, she will call her dentist this week, will start on oral antibiotics        Final Clinical Impression(s) / ED Diagnoses Final diagnoses:  Pain, dental  Viral URI    Rx / DC Orders ED  Discharge Orders          Ordered    amoxicillin (AMOXIL) 500 MG capsule  2 times daily        05/05/22 0322              Ripley Fraise, MD 05/05/22 [phone]

## 2022-08-05 ENCOUNTER — Other Ambulatory Visit (HOSPITAL_COMMUNITY): Payer: Self-pay | Admitting: Nurse Practitioner

## 2022-08-05 DIAGNOSIS — L089 Local infection of the skin and subcutaneous tissue, unspecified: Secondary | ICD-10-CM | POA: Diagnosis not present

## 2022-08-05 DIAGNOSIS — N852 Hypertrophy of uterus: Secondary | ICD-10-CM

## 2022-08-05 DIAGNOSIS — Z3041 Encounter for surveillance of contraceptive pills: Secondary | ICD-10-CM | POA: Diagnosis not present

## 2022-08-14 ENCOUNTER — Telehealth: Payer: Self-pay

## 2022-08-14 NOTE — Telephone Encounter (Signed)
Telephoned patient at home number. Left a voice message with [facility]CP (scholarship) contact information. 

## 2022-08-15 ENCOUNTER — Other Ambulatory Visit: Payer: Self-pay | Admitting: Nurse Practitioner

## 2022-08-15 DIAGNOSIS — Z1231 Encounter for screening mammogram for malignant neoplasm of breast: Secondary | ICD-10-CM

## 2022-08-27 ENCOUNTER — Ambulatory Visit (HOSPITAL_COMMUNITY)
Admission: RE | Admit: 2022-08-27 | Discharge: 2022-08-27 | Disposition: A | Discharge status: Home / Self Care | Payer: Medicaid Other | Source: Ambulatory Visit | Attending: Nurse Practitioner | Admitting: Nurse Practitioner

## 2022-08-27 DIAGNOSIS — N852 Hypertrophy of uterus: Secondary | ICD-10-CM | POA: Insufficient documentation

## 2022-09-16 ENCOUNTER — Ambulatory Visit (HOSPITAL_COMMUNITY)
Admission: RE | Admit: 2022-09-16 | Discharge: 2022-09-16 | Disposition: A | Discharge status: Home / Self Care | Payer: Medicaid Other | Source: Ambulatory Visit | Attending: Nurse Practitioner | Admitting: Nurse Practitioner

## 2022-09-16 DIAGNOSIS — Z1231 Encounter for screening mammogram for malignant neoplasm of breast: Secondary | ICD-10-CM

## 2023-01-02 ENCOUNTER — Other Ambulatory Visit (HOSPITAL_COMMUNITY): Payer: Self-pay | Admitting: Nurse Practitioner

## 2023-01-02 DIAGNOSIS — Z3041 Encounter for surveillance of contraceptive pills: Secondary | ICD-10-CM | POA: Diagnosis not present

## 2023-01-02 DIAGNOSIS — D219 Benign neoplasm of connective and other soft tissue, unspecified: Secondary | ICD-10-CM | POA: Diagnosis not present

## 2023-01-27 ENCOUNTER — Ambulatory Visit (HOSPITAL_COMMUNITY)
Admission: RE | Admit: 2023-01-27 | Discharge: 2023-01-27 | Disposition: A | Discharge status: Home / Self Care | Payer: Medicaid Other | Source: Ambulatory Visit | Attending: Nurse Practitioner | Admitting: Nurse Practitioner

## 2023-01-27 DIAGNOSIS — D219 Benign neoplasm of connective and other soft tissue, unspecified: Secondary | ICD-10-CM | POA: Insufficient documentation

## 2023-01-27 DIAGNOSIS — D251 Intramural leiomyoma of uterus: Secondary | ICD-10-CM | POA: Diagnosis not present

## 2023-02-27 DIAGNOSIS — Z20822 Contact with and (suspected) exposure to covid-19: Secondary | ICD-10-CM | POA: Diagnosis not present

## 2023-02-27 DIAGNOSIS — J0100 Acute maxillary sinusitis, unspecified: Secondary | ICD-10-CM | POA: Diagnosis not present

## 2023-03-17 DIAGNOSIS — I10 Essential (primary) hypertension: Secondary | ICD-10-CM | POA: Diagnosis not present

## 2023-03-17 DIAGNOSIS — E785 Hyperlipidemia, unspecified: Secondary | ICD-10-CM | POA: Diagnosis not present

## 2023-03-17 DIAGNOSIS — E049 Nontoxic goiter, unspecified: Secondary | ICD-10-CM | POA: Diagnosis not present

## 2023-04-07 ENCOUNTER — Other Ambulatory Visit (HOSPITAL_COMMUNITY): Payer: Self-pay | Admitting: Physician Assistant

## 2023-04-07 DIAGNOSIS — E049 Nontoxic goiter, unspecified: Secondary | ICD-10-CM

## 2023-04-16 ENCOUNTER — Ambulatory Visit (HOSPITAL_COMMUNITY): Payer: Medicaid Other

## 2023-04-16 ENCOUNTER — Ambulatory Visit (HOSPITAL_COMMUNITY)
Admission: RE | Admit: 2023-04-16 | Discharge: 2023-04-16 | Disposition: A | Discharge status: Home / Self Care | Payer: Medicaid Other | Source: Ambulatory Visit | Attending: Physician Assistant | Admitting: Physician Assistant

## 2023-04-16 DIAGNOSIS — E010 Iodine-deficiency related diffuse (endemic) goiter: Secondary | ICD-10-CM | POA: Diagnosis not present

## 2023-04-16 DIAGNOSIS — R221 Localized swelling, mass and lump, neck: Secondary | ICD-10-CM | POA: Diagnosis not present

## 2023-04-16 DIAGNOSIS — E049 Nontoxic goiter, unspecified: Secondary | ICD-10-CM | POA: Insufficient documentation

## 2023-05-12 DIAGNOSIS — H5213 Myopia, bilateral: Secondary | ICD-10-CM | POA: Diagnosis not present

## 2023-05-13 NOTE — Progress Notes (Unsigned)
New Patient Office Visit  Subjective    Patient ID: Tammy Rush, female    DOB: 03/02/1981  Age: 42 y.o. MRN: [id number]  CC: No chief complaint on file.   HPI Tammy Rush presents to establish care ***  Outpatient Encounter Medications as of 05/14/2023  Medication Sig   amoxicillin (AMOXIL) 500 MG capsule Take 1 capsule (500 mg total) by mouth 2 (two) times daily.   cholecalciferol (VITAMIN D3) 25 MCG (1000 UNIT) tablet Take 1,000 Units by mouth daily.   hydrochlorothiazide (MICROZIDE) 12.5 MG capsule Take 12.5 mg by mouth daily.   lisinopril (ZESTRIL) 20 MG tablet Take 20 mg by mouth daily.   LORazepam (ATIVAN) 1 MG tablet 1 po q6h prn anxiety   meclizine (ANTIVERT) 25 MG tablet Take 1 tablet (25 mg total) by mouth 3 (three) times daily as needed for dizziness.   norethindrone (MICRONOR) 0.35 MG tablet Take 1 tablet by mouth daily.   ondansetron (ZOFRAN ODT) 4 MG disintegrating tablet Take 1 tablet (4 mg total) by mouth every 8 (eight) hours as needed for nausea or vomiting. (Patient not taking: Reported on 01/18/2015)   Prenatal Vit-Fe Fumarate-FA (NAT-RUL PRENATAL VITAMINS) 28-0.8 MG TABS Take 1 tablet by mouth every morning. (Patient not taking: Reported on 10/31/2014)   No facility-administered encounter medications on file as of 05/14/2023.    Past Medical History:  Diagnosis Date   Hypertension    Miscarriage     No past surgical history on file.  Family History  Problem Relation Age of Onset   Hypertension Mother    Diabetes Mother    Hypertension Father     Social History   Socioeconomic History   Marital status: Single    Spouse name: Not on file   Number of children: 1   Years of education: Not on file   Highest education level: 12th grade  Occupational History   Not on file  Tobacco Use   Smoking status: Former    Current packs/day: 0.00    Types: Cigarettes    Quit date: 09/03/2017    Years since quitting: 5.6   Smokeless tobacco: Never   Vaping Use   Vaping status: Some Days  Substance and Sexual Activity   Alcohol use: No    Comment: occ   Drug use: No   Sexual activity: Yes    Birth control/protection: Pill  Other Topics Concern   Not on file  Social History Narrative   Not on file   Social Determinants of Health   Financial Resource Strain: Medium Risk (05/10/2023)   Overall Financial Resource Strain (CARDIA)    Difficulty of Paying Living Expenses: Somewhat hard  Food Insecurity: Food Insecurity Present (05/10/2023)   Hunger Vital Sign    Worried About Running Out of Food in the Last Year: Sometimes true    Ran Out of Food in the Last Year: Sometimes true  Transportation Needs: No Transportation Needs (05/10/2023)   PRAPARE - Administrator, Civil Service (Medical): No    Lack of Transportation (Non-Medical): No  Physical Activity: Insufficiently Active (05/10/2023)   Exercise Vital Sign    Days of Exercise per Week: 2 days    Minutes of Exercise per Session: 50 min  Stress: No Stress Concern Present (05/10/2023)   [employer] of Occupational Health - Occupational Stress Questionnaire    Feeling of Stress : Only a little  Social Connections: Moderately Isolated (05/10/2023)   Social Connection and Isolation Panel [  NHANES]    Frequency of Communication with Friends and Family: More than three times a week    Frequency of Social Gatherings with Friends and Family: Once a week    Attends Religious Services: 1 to 4 times per year    Active Member of [employer] or Organizations: No    Attends Engineer, structural: Not on file    Marital Status: Never married  Intimate Partner Violence: Not on file    ROS Negative unless indicated in HPI   Objective    There were no vitals taken for this visit.  Physical Exam  Last CBC Lab Results  Component Value Date   WBC 7.1 01/16/2020   HGB 12.3 01/16/2020   HCT 40.7 01/16/2020   MCV 76.6 (L) 01/16/2020   MCH 23.2 (L) 01/16/2020   RDW  15.2 01/16/2020   PLT 440 (H) 01/16/2020   Last metabolic panel Lab Results  Component Value Date   GLUCOSE 98 01/16/2020   NA 139 01/16/2020   K 3.2 (L) 01/16/2020   CL 102 01/16/2020   CO2 26 01/16/2020   BUN 10 01/16/2020   CREATININE 0.67 01/16/2020   GFRNONAA >60 01/16/2020   CALCIUM 9.1 01/16/2020   PROT 7.6 01/16/2020   ALBUMIN 3.9 01/16/2020   BILITOT 0.4 01/16/2020   ALKPHOS 44 01/16/2020   AST 15 01/16/2020   ALT 18 01/16/2020   ANIONGAP 11 01/16/2020     Assessment & Plan:  There are no diagnoses linked to this encounter.  Continue healthy lifestyle choices, including diet (rich in fruits, vegetables, and lean proteins, and low in salt and simple carbohydrates) and exercise (at least 30 minutes of moderate physical activity daily).     The above assessment and management plan was discussed with the patient. The patient verbalized understanding of and has agreed to the management plan. Patient is aware to call the clinic if they develop any new symptoms or if symptoms persist or worsen. Patient is aware when to return to the clinic for a follow-up visit. Patient educated on when it is appropriate to go to the emergency department.  No follow-ups on file.   Arrie Aran Santa Lighter, DNP [facility] [address] [place], [place] [address] [phone]

## 2023-05-14 ENCOUNTER — Ambulatory Visit: Payer: Medicaid Other | Admitting: Nurse Practitioner

## 2023-05-14 ENCOUNTER — Encounter: Payer: Self-pay | Admitting: Nurse Practitioner

## 2023-05-14 ENCOUNTER — Telehealth: Payer: Self-pay | Admitting: Nurse Practitioner

## 2023-05-14 VITALS — BP 138/82 | HR 69 | Temp 98.1°F | Ht 66.0 in | Wt 227.8 lb

## 2023-05-14 DIAGNOSIS — Z0000 Encounter for general adult medical examination without abnormal findings: Secondary | ICD-10-CM | POA: Insufficient documentation

## 2023-05-14 DIAGNOSIS — Z23 Encounter for immunization: Secondary | ICD-10-CM | POA: Diagnosis not present

## 2023-05-14 DIAGNOSIS — E559 Vitamin D deficiency, unspecified: Secondary | ICD-10-CM

## 2023-05-14 DIAGNOSIS — H60311 Diffuse otitis externa, right ear: Secondary | ICD-10-CM | POA: Insufficient documentation

## 2023-05-14 DIAGNOSIS — Z0001 Encounter for general adult medical examination with abnormal findings: Secondary | ICD-10-CM

## 2023-05-14 DIAGNOSIS — I10 Essential (primary) hypertension: Secondary | ICD-10-CM | POA: Diagnosis not present

## 2023-05-14 DIAGNOSIS — H6501 Acute serous otitis media, right ear: Secondary | ICD-10-CM | POA: Diagnosis not present

## 2023-05-14 DIAGNOSIS — Z833 Family history of diabetes mellitus: Secondary | ICD-10-CM | POA: Diagnosis not present

## 2023-05-14 LAB — BAYER DCA HB A1C WAIVED: HB A1C (BAYER DCA - WAIVED): 5.7 % — ABNORMAL HIGH (ref 4.8–5.6)

## 2023-05-14 MED ORDER — CIPROFLOXACIN-DEXAMETHASONE 0.3-0.1 % OT SUSP: 4.0000 [drp] | Freq: Two times a day (BID) | OTIC | 0 refills | Status: DC

## 2023-05-14 MED ORDER — AMOXICILLIN 875 MG PO TABS: 875.0000 mg | ORAL_TABLET | Freq: Two times a day (BID) | ORAL | 0 refills | Status: DC

## 2023-05-14 MED ORDER — AMLODIPINE BESYLATE 5 MG PO TABS: 5.0000 mg | ORAL_TABLET | Freq: Every day | ORAL | 2 refills | Status: DC

## 2023-05-14 NOTE — Telephone Encounter (Signed)
Pt aware and verbalized understanding.

## 2023-05-15 ENCOUNTER — Encounter: Payer: Self-pay | Admitting: Nurse Practitioner

## 2023-05-15 LAB — VITAMIN D 25 HYDROXY (VIT D DEFICIENCY, FRACTURES): Vit D, 25-Hydroxy: 47.1 ng/mL (ref 30.0–100.0)

## 2023-05-15 LAB — CBC WITH DIFFERENTIAL/PLATELET
Basophils Absolute: 0 10*3/uL (ref 0.0–0.2)
Basos: 0 %
EOS (ABSOLUTE): 0.1 10*3/uL (ref 0.0–0.4)
Eos: 1 %
Hematocrit: 40.6 % (ref 34.0–46.6)
Hemoglobin: 12.4 g/dL (ref 11.1–15.9)
Immature Grans (Abs): 0 10*3/uL (ref 0.0–0.1)
Immature Granulocytes: 1 %
Lymphocytes Absolute: 1.8 10*3/uL (ref 0.7–3.1)
Lymphs: 28 %
MCH: 23.3 pg — ABNORMAL LOW (ref 26.6–33.0)
MCHC: 30.5 g/dL — ABNORMAL LOW (ref 31.5–35.7)
MCV: 76 fL — ABNORMAL LOW (ref 79–97)
Monocytes Absolute: 0.6 10*3/uL (ref 0.1–0.9)
Monocytes: 9 %
Neutrophils Absolute: 3.8 10*3/uL (ref 1.4–7.0)
Neutrophils: 61 %
Platelets: 457 10*3/uL — ABNORMAL HIGH (ref 150–450)
RBC: 5.32 x10E6/uL — ABNORMAL HIGH (ref 3.77–5.28)
RDW: 15.8 % — ABNORMAL HIGH (ref 11.7–15.4)
WBC: 6.4 10*3/uL (ref 3.4–10.8)

## 2023-05-15 LAB — CMP14+EGFR
ALT: 15 IU/L (ref 0–32)
AST: 13 IU/L (ref 0–40)
Albumin: 4 g/dL (ref 3.9–4.9)
Alkaline Phosphatase: 57 IU/L (ref 44–121)
BUN/Creatinine Ratio: 15 (ref 9–23)
BUN: 10 mg/dL (ref 6–24)
Bilirubin Total: 0.3 mg/dL (ref 0.0–1.2)
CO2: 25 mmol/L (ref 20–29)
Calcium: 9.3 mg/dL (ref 8.7–10.2)
Chloride: 100 mmol/L (ref 96–106)
Creatinine, Ser: 0.68 mg/dL (ref 0.57–1.00)
Globulin, Total: 2.8 g/dL (ref 1.5–4.5)
Glucose: 91 mg/dL (ref 70–99)
Potassium: 4.4 mmol/L (ref 3.5–5.2)
Sodium: 139 mmol/L (ref 134–144)
Total Protein: 6.8 g/dL (ref 6.0–8.5)
eGFR: 111 mL/min/{1.73_m2} (ref >59)

## 2023-05-15 LAB — LIPID PANEL
Chol/HDL Ratio: 3.6 ratio (ref 0.0–4.4)
Cholesterol, Total: 202 mg/dL — ABNORMAL HIGH (ref 100–199)
HDL: 56 mg/dL (ref >39)
LDL Chol Calc (NIH): 131 mg/dL — ABNORMAL HIGH (ref 0–99)
Triglycerides: 83 mg/dL (ref 0–149)
VLDL Cholesterol Cal: 15 mg/dL (ref 5–40)

## 2023-05-15 LAB — THYROID PANEL WITH TSH
Free Thyroxine Index: 2.5 (ref 1.2–4.9)
T3 Uptake Ratio: 25 % (ref 24–39)
T4, Total: 10.1 ug/dL (ref 4.5–12.0)
TSH: 0.901 u[IU]/mL (ref 0.450–4.500)

## 2023-05-27 ENCOUNTER — Encounter (HOSPITAL_COMMUNITY): Payer: Self-pay

## 2023-05-27 ENCOUNTER — Emergency Department (HOSPITAL_COMMUNITY)
Admission: EM | Admit: 2023-05-27 | Discharge: 2023-05-28 | Disposition: A | Discharge status: Home / Self Care | Payer: Medicaid Other | Attending: Emergency Medicine | Admitting: Emergency Medicine

## 2023-05-27 ENCOUNTER — Emergency Department (HOSPITAL_COMMUNITY): Payer: Medicaid Other

## 2023-05-27 ENCOUNTER — Other Ambulatory Visit: Payer: Self-pay

## 2023-05-27 DIAGNOSIS — R0981 Nasal congestion: Secondary | ICD-10-CM

## 2023-05-27 DIAGNOSIS — I10 Essential (primary) hypertension: Secondary | ICD-10-CM | POA: Insufficient documentation

## 2023-05-27 DIAGNOSIS — Z87891 Personal history of nicotine dependence: Secondary | ICD-10-CM | POA: Diagnosis not present

## 2023-05-27 DIAGNOSIS — H938X3 Other specified disorders of ear, bilateral: Secondary | ICD-10-CM | POA: Diagnosis not present

## 2023-05-27 DIAGNOSIS — R0789 Other chest pain: Secondary | ICD-10-CM | POA: Diagnosis not present

## 2023-05-27 DIAGNOSIS — R0989 Other specified symptoms and signs involving the circulatory and respiratory systems: Secondary | ICD-10-CM | POA: Diagnosis not present

## 2023-05-27 HISTORY — DX: Disorder of thyroid, unspecified: E07.9

## 2023-05-27 MED ORDER — PSEUDOEPHEDRINE HCL 30 MG PO TABS: 30.0000 mg | ORAL_TABLET | ORAL | 0 refills | Status: DC | PRN

## 2023-05-27 MED ORDER — DIPHENHYDRAMINE HCL 25 MG PO TABS: 25.0000 mg | ORAL_TABLET | Freq: Four times a day (QID) | ORAL | 0 refills | Status: DC

## 2023-05-27 NOTE — ED Triage Notes (Addendum)
Pt states day before yesterday nose began running and ears started popping and were making a crackling noise. Symptoms got worse yesterday. Ear infection couple of weeks ago received abx.

## 2023-05-27 NOTE — Discharge Instructions (Addendum)
You were evaluated in the Emergency Department and after careful evaluation, we did not find any emergent condition requiring admission or further testing in the hospital.  Your exam/testing today is overall reassuring.  Symptoms may be due to allergies or a viral illness.  Recommend taking the Sudafed to help with the congestion, can also try the Benadryl every 4-6 hours.  Plenty of fluids and rest, can use Tylenol or Motrin for discomfort.  Please return to the Emergency Department if you experience any worsening of your condition.   Thank you for allowing [place] to be a part of your care.

## 2023-05-28 NOTE — ED Provider Notes (Signed)
AP-EMERGENCY DEPT [facility] Emergency Department Provider Note MRN:  [id number]  Arrival date & time: 05/28/23     Chief Complaint   Congestion History of Present Illness   Tammy Rush is a 42 y.o. year-old female with a history of hypertension presenting to the ED with chief complaint of congestion.  Ever since cleaning out the closet the other day she has pressure and congestion to the ears and nose, head feels like a balloon.  Thinks she may be allergic to something in that closet.  Has known seasonal allergies.  Denies fever or cough or sore throat.  Review of Systems  A thorough review of systems was obtained and all systems are negative except as noted in the HPI and PMH.   Patient's Health History    Past Medical History:  Diagnosis Date   Hypertension    Miscarriage    Thyroid disease     History reviewed. No pertinent surgical history.  Family History  Problem Relation Age of Onset   Hypertension Mother    Diabetes Mother    Hypertension Father     Social History   Socioeconomic History   Marital status: Single    Spouse name: Not on file   Number of children: 1   Years of education: Not on file   Highest education level: 12th grade  Occupational History   Not on file  Tobacco Use   Smoking status: Former    Current packs/day: 0.00    Types: Cigarettes    Quit date: 09/03/2017    Years since quitting: 5.7   Smokeless tobacco: Never  Vaping Use   Vaping status: Some Days  Substance and Sexual Activity   Alcohol use: No   Drug use: No   Sexual activity: Yes    Birth control/protection: Pill  Other Topics Concern   Not on file  Social History Narrative   Not on file   Social Determinants of Health   Financial Resource Strain: Medium Risk (05/10/2023)   Overall Financial Resource Strain (CARDIA)    Difficulty of Paying Living Expenses: Somewhat hard  Food Insecurity: Food Insecurity Present (05/10/2023)   Hunger Vital Sign     Worried About Running Out of Food in the Last Year: Sometimes true    Ran Out of Food in the Last Year: Sometimes true  Transportation Needs: No Transportation Needs (05/10/2023)   PRAPARE - Administrator, Civil Service (Medical): No    Lack of Transportation (Non-Medical): No  Physical Activity: Insufficiently Active (05/10/2023)   Exercise Vital Sign    Days of Exercise per Week: 2 days    Minutes of Exercise per Session: 50 min  Stress: No Stress Concern Present (05/10/2023)   [employer] of Occupational Health - Occupational Stress Questionnaire    Feeling of Stress : Only a little  Social Connections: Moderately Isolated (05/10/2023)   Social Connection and Isolation Panel [NHANES]    Frequency of Communication with Friends and Family: More than three times a week    Frequency of Social Gatherings with Friends and Family: Once a week    Attends Religious Services: 1 to 4 times per year    Active Member of [employer] or Organizations: No    Attends Banker Meetings: Not on file    Marital Status: Never married  Intimate Partner Violence: Not on file     Physical Exam   Vitals:   05/27/23 2032 05/27/23 2330  BP: (!) 152/87 132/81  Pulse: 85 79  Resp: 13 16  Temp: 98.7 F (37.1 C)   SpO2: 99% 99%    CONSTITUTIONAL: Well-appearing, NAD NEURO/PSYCH:  Alert and oriented x 3, no focal deficits EYES:  eyes equal and reactive ENT/NECK:  no LAD, no JVD CARDIO: Regular rate, well-perfused, normal S1 and S2 PULM:  CTAB no wheezing or rhonchi GI/GU:  non-distended, non-tender MSK/SPINE:  No gross deformities, no edema SKIN:  no rash, atraumatic   *Additional and/or pertinent findings included in MDM below  Diagnostic and Interventional Summary    EKG Interpretation Date/Time:    Ventricular Rate:    PR Interval:    QRS Duration:    QT Interval:    QTC Calculation:   R Axis:      Text Interpretation:         Labs Reviewed - No data to  display  DG Chest 2 View  Final Result      Medications - No data to display   Procedures  /  Critical Care Procedures  ED Course and Medical Decision Making  Initial Impression and Ddx Patient is well-appearing with normal vital signs, no increased work of breathing, clear lungs, given history highly doubt emergent process, suspect viral illness or seasonal allergies.  Past medical/surgical history that increases complexity of ED encounter: Hypertension, seasonal allergies  Interpretation of Diagnostics I personally reviewed the Chest Xray and my interpretation is as follows: Chest x-ray ordered in triage is normal.    Patient Reassessment and Ultimate Disposition/Management     Discharge  Patient management required discussion with the following services or consulting groups:  None  Complexity of Problems Addressed Acute complicated illness or Injury  Additional Data Reviewed and Analyzed Further history obtained from: None  Additional Factors Impacting ED Encounter Risk Prescriptions  Elmer Sow. Pilar Plate, MD [facility] [email or web address]  Final Clinical Impressions(s) / ED Diagnoses     ICD-10-CM   1. Congestion of both ears  H93.8X3     2. Nasal congestion  R09.81       ED Discharge Orders          Ordered    pseudoephedrine (SUDAFED) 30 MG tablet  Every 4 hours PRN        05/27/23 2353    diphenhydrAMINE (BENADRYL) 25 MG tablet  Every 6 hours        05/27/23 2353             Discharge Instructions Discussed with and Provided to Patient:    Discharge Instructions      You were evaluated in the Emergency Department and after careful evaluation, we did not find any emergent condition requiring admission or further testing in the hospital.  Your exam/testing today is overall reassuring.  Symptoms may be due to allergies or a viral illness.  Recommend taking the Sudafed to help with the congestion,  can also try the Benadryl every 4-6 hours.  Plenty of fluids and rest, can use Tylenol or Motrin for discomfort.  Please return to the Emergency Department if you experience any worsening of your condition.   Thank you for allowing [place] to be a part of your care.      Sabas Sous, MD 05/28/23 Marlyne Beards

## 2023-06-07 IMAGING — MG MM DIGITAL DIAGNOSTIC UNILAT*L* W/ TOMO W/ CAD
4 series · 4 of 12 positions shown · non-contrast
Comparison: Previous exam(s).
COMPARISON: Previous exam(s).

Addendum:
CLINICAL DATA: Patient returns after screening study for evaluation
of possible LEFT breast mass.

EXAM:
DIGITAL DIAGNOSTIC UNILATERAL LEFT MAMMOGRAM WITH TOMOSYNTHESIS AND
CAD; ULTRASOUND LEFT BREAST LIMITED
TECHNIQUE: Left digital diagnostic mammography and breast tomosynthesis was
performed. The images were evaluated with computer-aided detection.;
Targeted ultrasound examination of the left breast was performed.

[L MLO synth-2D]
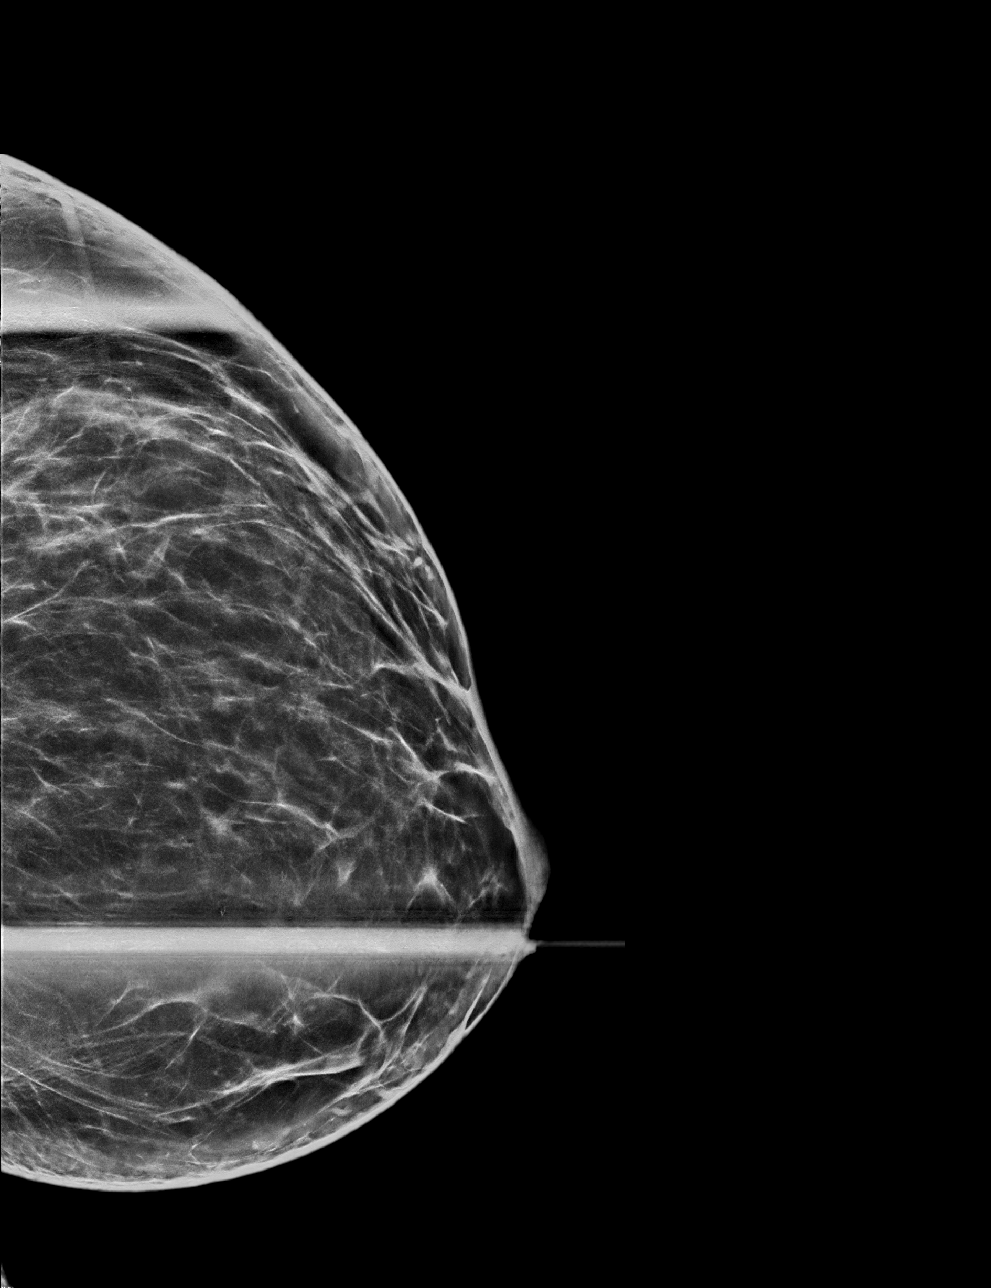

[L CC synth-2D]
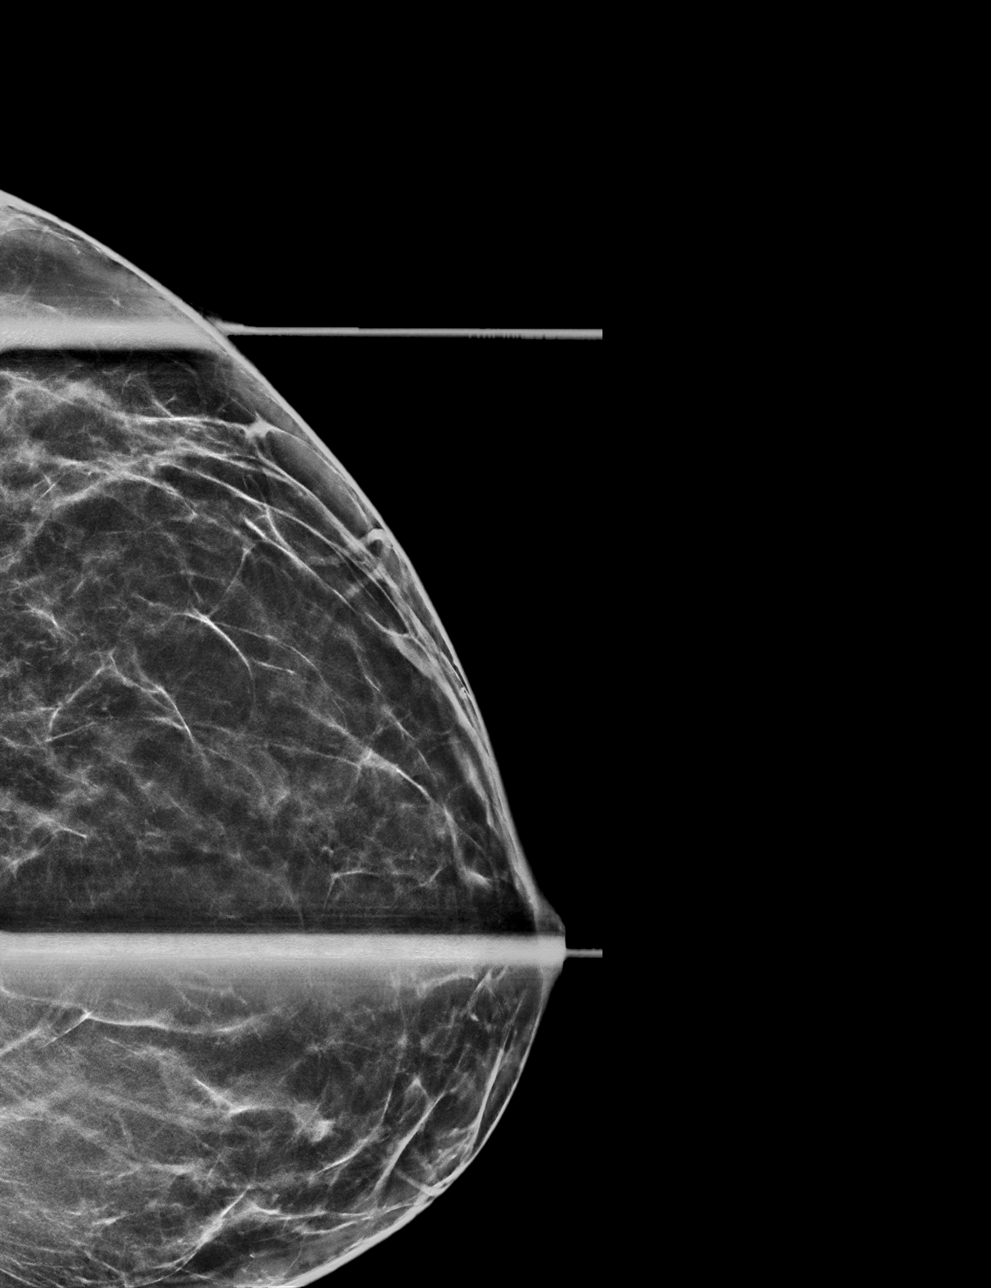

[L CC tomo · tomo slice 32/63.0]
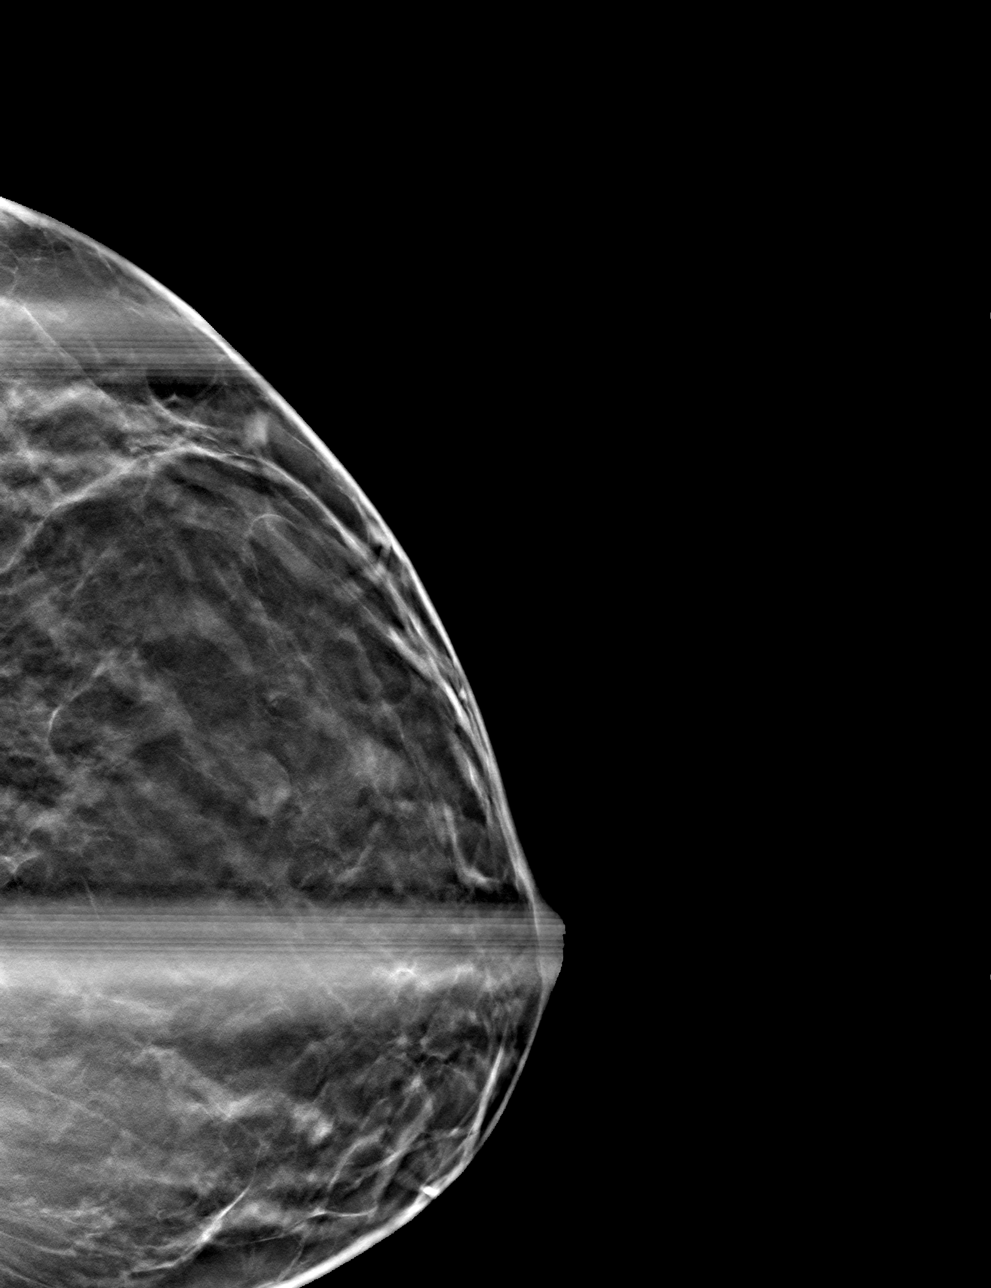

[L MLO tomo · tomo slice 38/75.0]
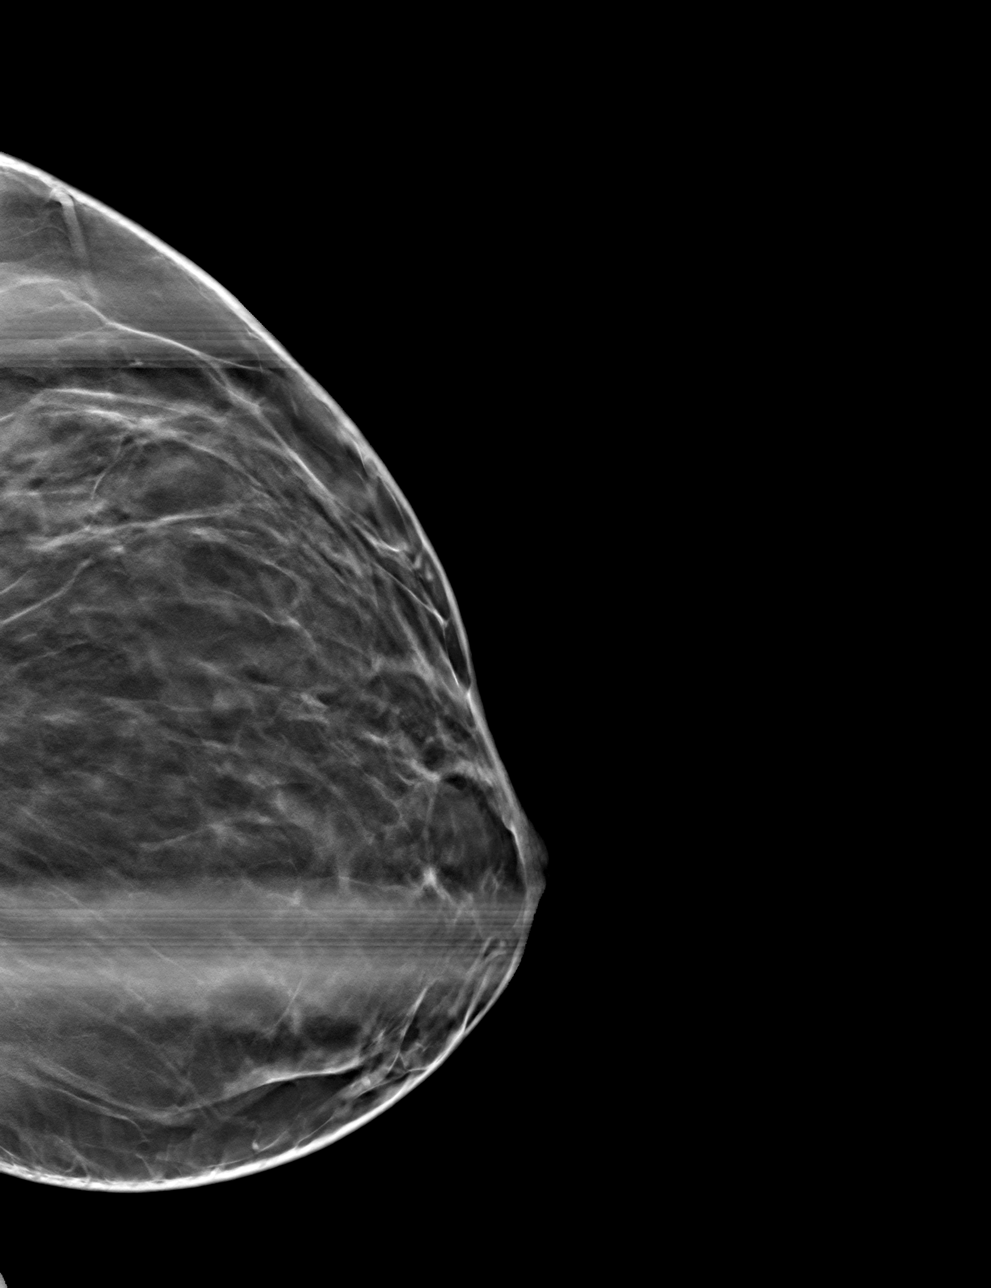

[4 of 12 positions shown; findings below may reference images not displayed]

ACR Breast Density Category b: There are scattered areas of
fibroglandular density.
FINDINGS: Additional 2-D and 3-D images are performed. These views confirm
presence of a 1 centimeter circumscribed oval mass in the LATERAL
portion of the LEFT breast and further evaluated with ultrasound.

Targeted ultrasound is performed, showing a simple cyst with thin
internal septation in the 3 o'clock location of the LEFT breast 3
centimeters from the nipple measuring 0.8 x 0 4 x 1.0 centimeters.
No internal blood flow or solid component.
IMPRESSION: Benign cyst in the LEFT breast. No mammographic or ultrasound
evidence for malignancy.

RECOMMENDATION:
Insert screen

I have discussed the findings and recommendations with the patient.
If applicable, a reminder letter will be sent to the patient
regarding the next appointment.

BI-RADS CATEGORY  2: Benign.

ADDENDUM:
Corrected report:

Recommendation:

Screening mammogram in one year.(Code:T0-U-R7E)

*** End of Addendum ***
ACR Breast Density Category b: There are scattered areas of
fibroglandular density.
FINDINGS: Additional 2-D and 3-D images are performed. These views confirm
presence of a 1 centimeter circumscribed oval mass in the LATERAL
portion of the LEFT breast and further evaluated with ultrasound.

Targeted ultrasound is performed, showing a simple cyst with thin
internal septation in the 3 o'clock location of the LEFT breast 3
centimeters from the nipple measuring 0.8 x 0 4 x 1.0 centimeters.
No internal blood flow or solid component.
IMPRESSION: Benign cyst in the LEFT breast. No mammographic or ultrasound
evidence for malignancy.

RECOMMENDATION:
Insert screen

I have discussed the findings and recommendations with the patient.
If applicable, a reminder letter will be sent to the patient
regarding the next appointment.

BI-RADS CATEGORY  2: Benign.

## 2023-06-07 IMAGING — US US BREAST*L* LIMITED INC AXILLA
1 series · 6 of 6 positions shown · non-contrast
Comparison: Previous exam(s).
COMPARISON: Previous exam(s).

Addendum:
CLINICAL DATA: Patient returns after screening study for evaluation
of possible LEFT breast mass.

EXAM:
DIGITAL DIAGNOSTIC UNILATERAL LEFT MAMMOGRAM WITH TOMOSYNTHESIS AND
CAD; ULTRASOUND LEFT BREAST LIMITED
TECHNIQUE: Left digital diagnostic mammography and breast tomosynthesis was
performed. The images were evaluated with computer-aided detection.;
Targeted ultrasound examination of the left breast was performed.

[Series 1: us breast*left* limited inc axilla · 0.07mm/px · 6 of 6 slices shown]
[im 1/6]
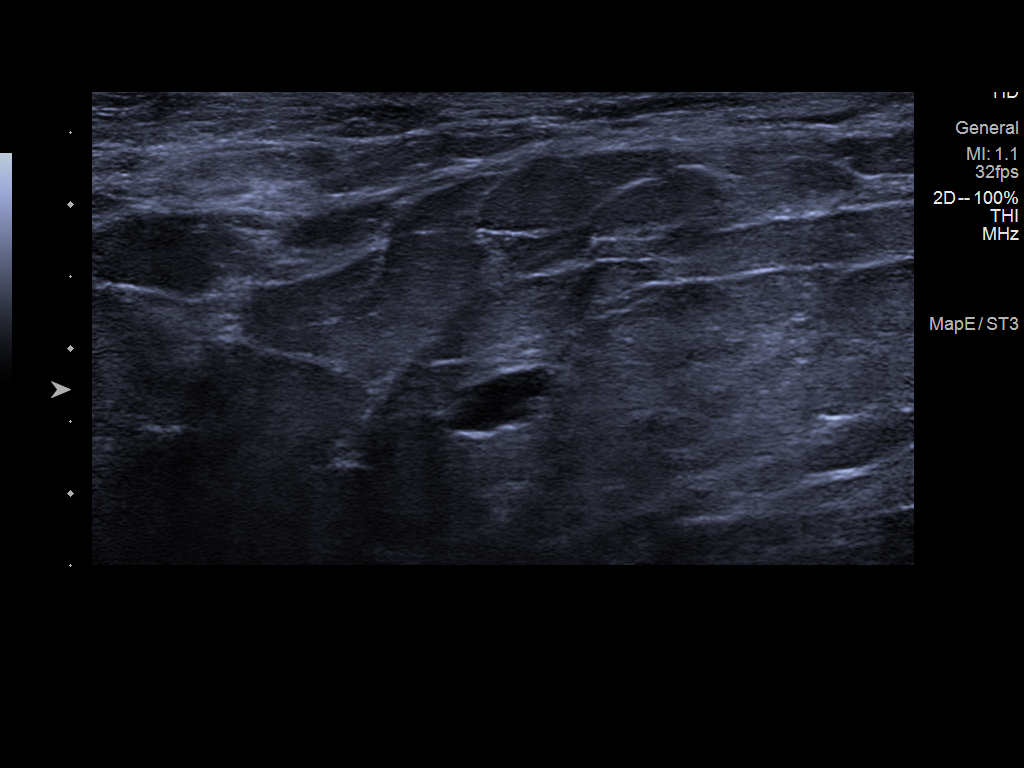
[im 2/6]
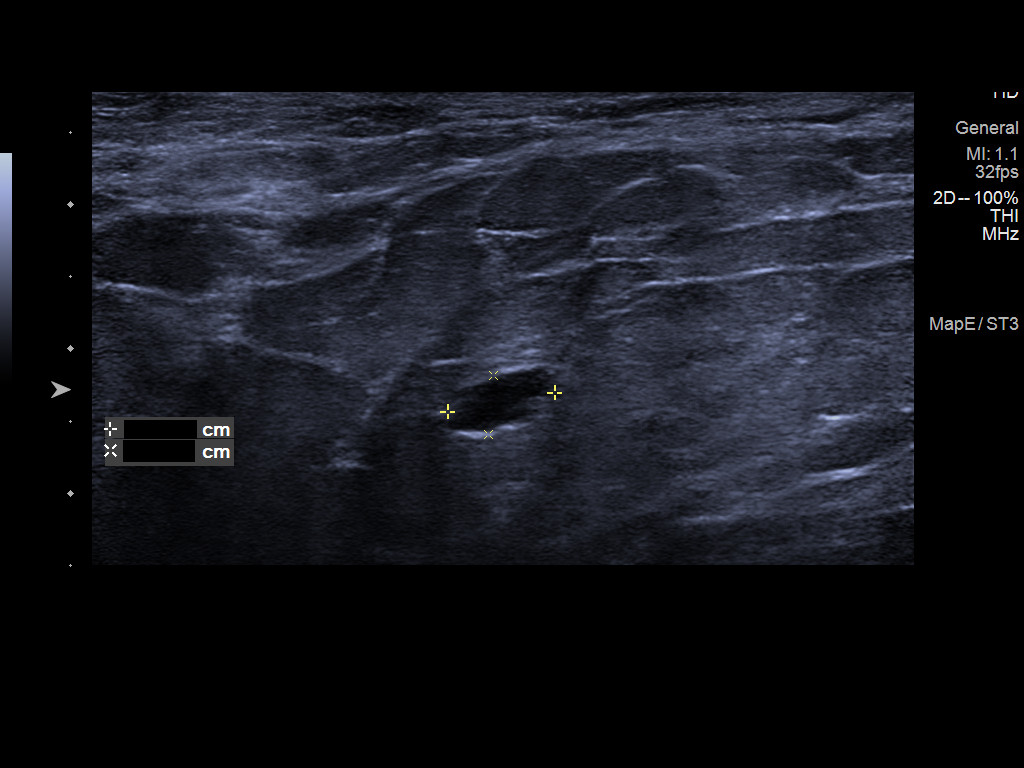
[im 3/6]
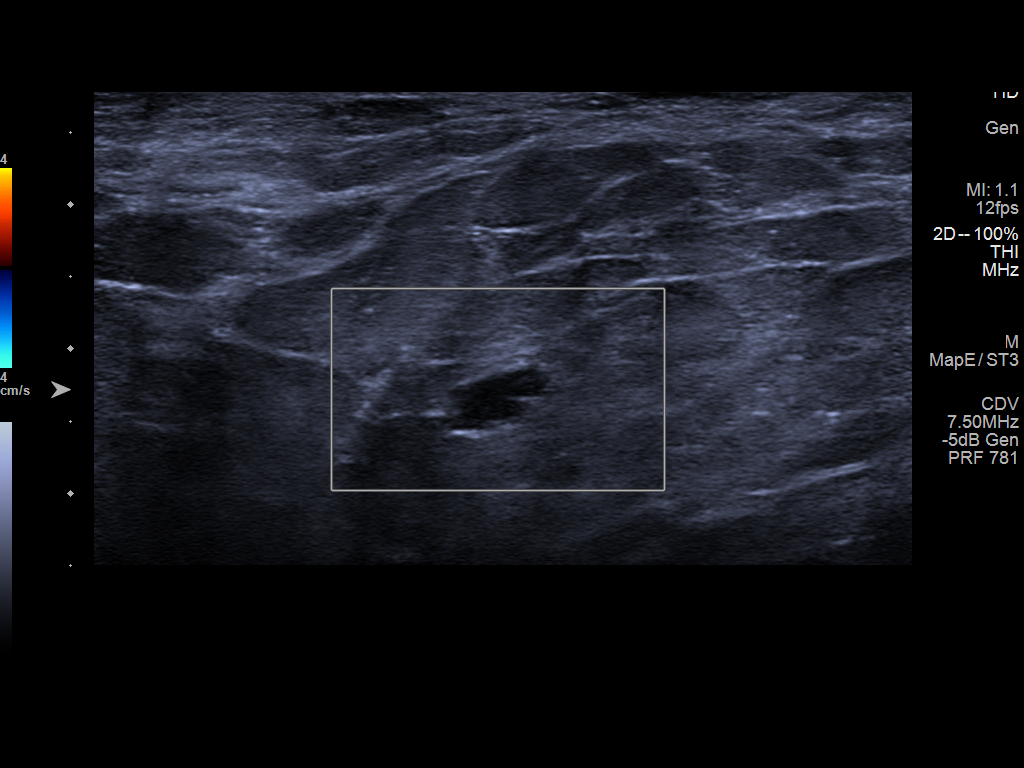
[im 4/6]
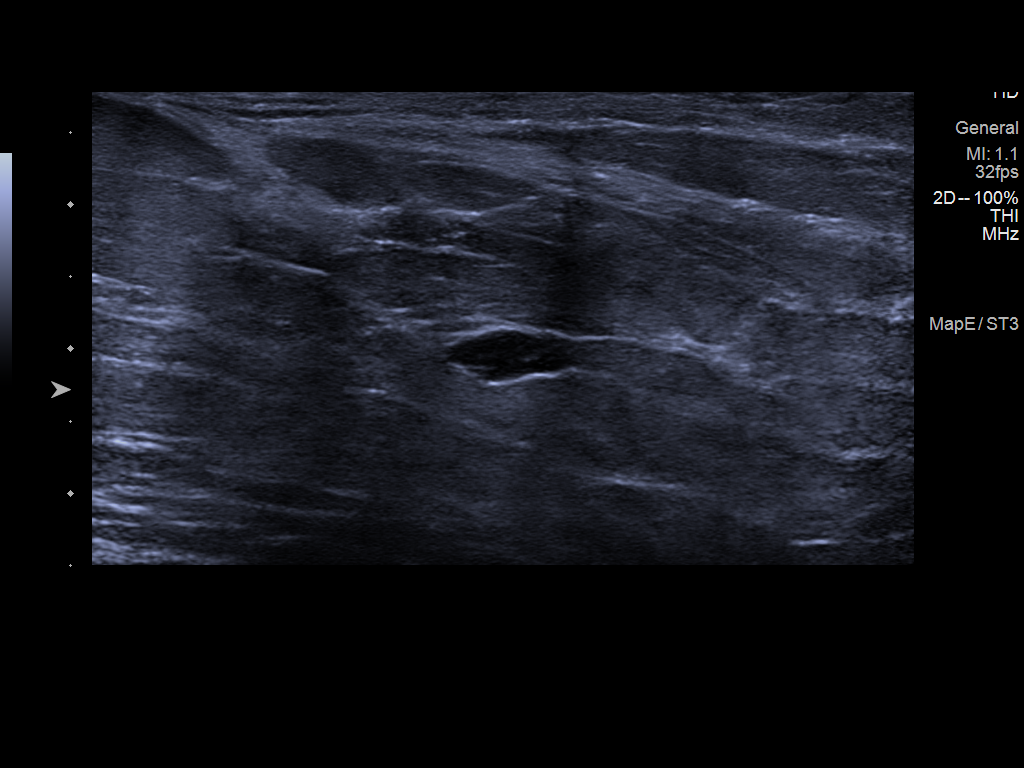
[im 5/6]
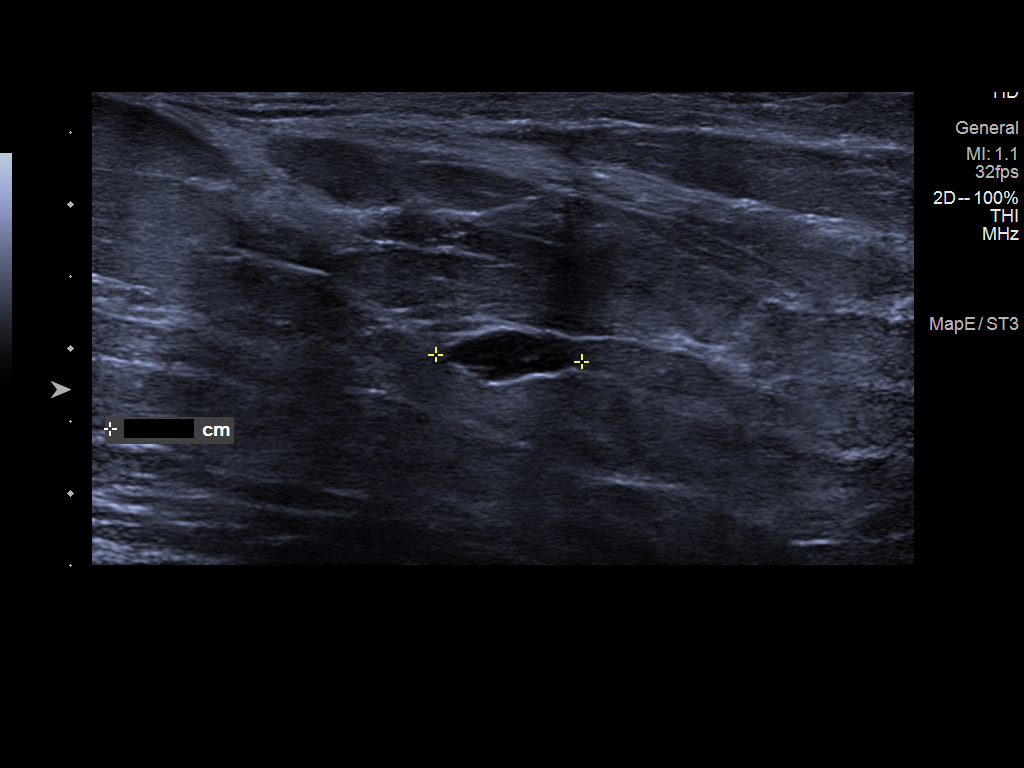
[im 6/6]
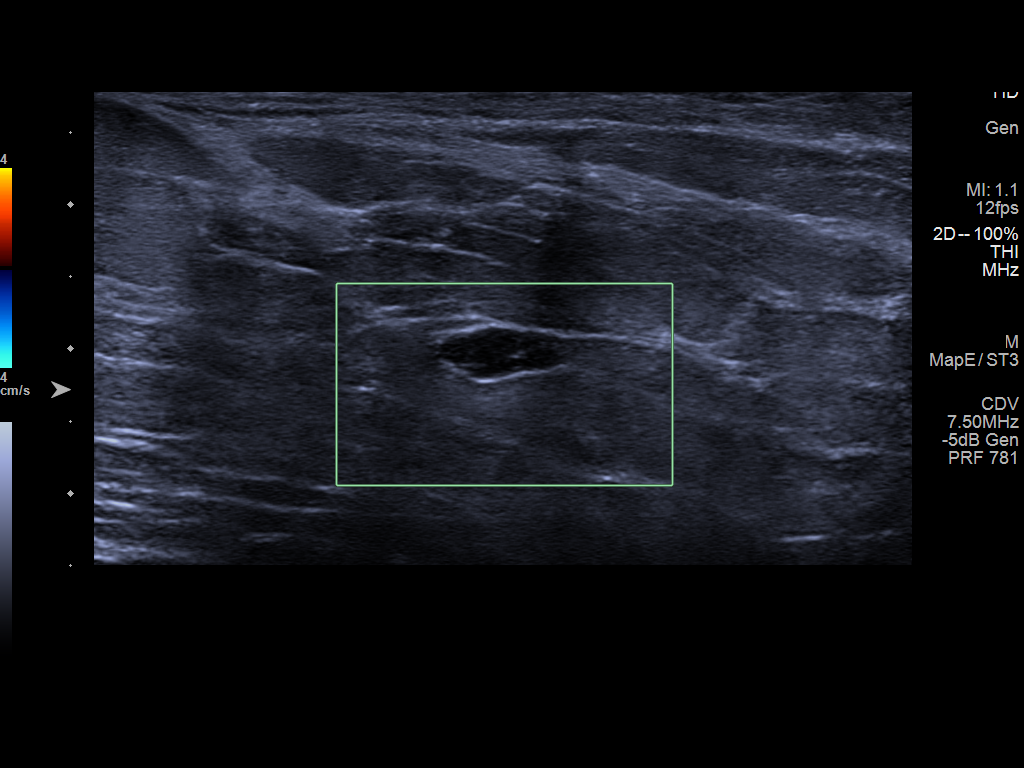

[6 of 6 positions shown; findings below may reference images not displayed]

ACR Breast Density Category b: There are scattered areas of
fibroglandular density.
FINDINGS: Additional 2-D and 3-D images are performed. These views confirm
presence of a 1 centimeter circumscribed oval mass in the LATERAL
portion of the LEFT breast and further evaluated with ultrasound.

Targeted ultrasound is performed, showing a simple cyst with thin
internal septation in the 3 o'clock location of the LEFT breast 3
centimeters from the nipple measuring 0.8 x 0 4 x 1.0 centimeters.
No internal blood flow or solid component.
IMPRESSION: Benign cyst in the LEFT breast. No mammographic or ultrasound
evidence for malignancy.

RECOMMENDATION:
Insert screen

I have discussed the findings and recommendations with the patient.
If applicable, a reminder letter will be sent to the patient
regarding the next appointment.

BI-RADS CATEGORY  2: Benign.

ADDENDUM:
Corrected report:

Recommendation:

Screening mammogram in one year.(Code:[id number]-R7E)

*** End of Addendum ***
ACR Breast Density Category b: There are scattered areas of
fibroglandular density.
FINDINGS: Additional 2-D and 3-D images are performed. These views confirm
presence of a 1 centimeter circumscribed oval mass in the LATERAL
portion of the LEFT breast and further evaluated with ultrasound.

Targeted ultrasound is performed, showing a simple cyst with thin
internal septation in the 3 o'clock location of the LEFT breast 3
centimeters from the nipple measuring 0.8 x 0 4 x 1.0 centimeters.
No internal blood flow or solid component.
IMPRESSION: Benign cyst in the LEFT breast. No mammographic or ultrasound
evidence for malignancy.

RECOMMENDATION:
Insert screen

I have discussed the findings and recommendations with the patient.
If applicable, a reminder letter will be sent to the patient
regarding the next appointment.

BI-RADS CATEGORY  2: Benign.

## 2023-06-16 ENCOUNTER — Ambulatory Visit: Payer: Medicaid Other | Admitting: Nurse Practitioner

## 2023-06-18 ENCOUNTER — Encounter: Payer: Self-pay | Admitting: Nurse Practitioner

## 2023-06-18 NOTE — Progress Notes (Unsigned)
Established Patient Office Visit  Subjective   Patient ID: Tammy Rush, female    DOB: 08-Aug-1981  Age: 42 y.o. MRN: [id number]  No chief complaint on file.   HPI  Patient Active Problem List   Diagnosis Date Noted   Primary hypertension 05/14/2023   Routine medical exam 05/14/2023   Vitamin D deficiency 05/14/2023   Family history of diabetes mellitus in mother 05/14/2023   Acute diffuse otitis externa of right ear 05/14/2023   Past Medical History:  Diagnosis Date   Hypertension    Miscarriage    Thyroid disease    No past surgical history on file. Social History   Tobacco Use   Smoking status: Former    Current packs/day: 0.00    Types: Cigarettes    Quit date: 09/03/2017    Years since quitting: 5.7   Smokeless tobacco: Never  Vaping Use   Vaping status: Some Days  Substance Use Topics   Alcohol use: No   Drug use: No   Social History   Socioeconomic History   Marital status: Single    Spouse name: Not on file   Number of children: 1   Years of education: Not on file   Highest education level: 12th grade  Occupational History   Not on file  Tobacco Use   Smoking status: Former    Current packs/day: 0.00    Types: Cigarettes    Quit date: 09/03/2017    Years since quitting: 5.7   Smokeless tobacco: Never  Vaping Use   Vaping status: Some Days  Substance and Sexual Activity   Alcohol use: No   Drug use: No   Sexual activity: Yes    Birth control/protection: Pill  Other Topics Concern   Not on file  Social History Narrative   Not on file   Social Determinants of Health   Financial Resource Strain: Medium Risk (06/12/2023)   Overall Financial Resource Strain (CARDIA)    Difficulty of Paying Living Expenses: Somewhat hard  Food Insecurity: Food Insecurity Present (06/12/2023)   Hunger Vital Sign    Worried About Running Out of Food in the Last Year: Never true    Ran Out of Food in the Last Year: Sometimes true  Transportation Needs:  No Transportation Needs (06/12/2023)   PRAPARE - Administrator, Civil Service (Medical): No    Lack of Transportation (Non-Medical): No  Physical Activity: Insufficiently Active (06/12/2023)   Exercise Vital Sign    Days of Exercise per Week: 3 days    Minutes of Exercise per Session: 20 min  Stress: No Stress Concern Present (06/12/2023)   [employer] of Occupational Health - Occupational Stress Questionnaire    Feeling of Stress : Not at all  Social Connections: Moderately Isolated (06/12/2023)   Social Connection and Isolation Panel [NHANES]    Frequency of Communication with Friends and Family: More than three times a week    Frequency of Social Gatherings with Friends and Family: More than three times a week    Attends Religious Services: 1 to 4 times per year    Active Member of [employer] or Organizations: No    Attends Engineer, structural: Not on file    Marital Status: Never married  Intimate Partner Violence: Not on file   Family Status  Relation Name Status   Mother  Alive   Father  Alive  No partnership data on file   Family History  Problem Relation Age of  Onset   Hypertension Mother    Diabetes Mother    Hypertension Father    No Known Allergies    ROS Negative unless indicated in HPI   Objective:     LMP 05/13/2023 (Approximate)  BP Readings from Last 3 Encounters:  05/28/23 (!) 154/85  05/14/23 138/82  05/05/22 (!) 149/106   Wt Readings from Last 3 Encounters:  05/27/23 228 lb (103.4 kg)  05/14/23 227 lb 12.8 oz (103.3 kg)  05/05/22 233 lb 11 oz (106 kg)      Physical Exam   No results found for any visits on 06/19/23.  Last CBC Lab Results  Component Value Date   WBC 6.4 05/14/2023   HGB 12.4 05/14/2023   HCT 40.6 05/14/2023   MCV 76 (L) 05/14/2023   MCH 23.3 (L) 05/14/2023   RDW 15.8 (H) 05/14/2023   PLT 457 (H) 05/14/2023   Last metabolic panel Lab Results  Component Value Date   GLUCOSE 91 05/14/2023    NA 139 05/14/2023   K 4.4 05/14/2023   CL 100 05/14/2023   CO2 25 05/14/2023   BUN 10 05/14/2023   CREATININE 0.68 05/14/2023   EGFR 111 05/14/2023   CALCIUM 9.3 05/14/2023   PROT 6.8 05/14/2023   ALBUMIN 4.0 05/14/2023   LABGLOB 2.8 05/14/2023   BILITOT 0.3 05/14/2023   ALKPHOS 57 05/14/2023   AST 13 05/14/2023   ALT 15 05/14/2023   ANIONGAP 11 01/16/2020   Last lipids Lab Results  Component Value Date   CHOL 202 (H) 05/14/2023   HDL 56 05/14/2023   LDLCALC 131 (H) 05/14/2023   TRIG 83 05/14/2023   CHOLHDL 3.6 05/14/2023   Last hemoglobin A1c Lab Results  Component Value Date   HGBA1C 5.7 (H) 05/14/2023   Last thyroid functions Lab Results  Component Value Date   TSH 0.901 05/14/2023   T4TOTAL 10.1 05/14/2023        Assessment & Plan:  There are no diagnoses linked to this encounter. Continue healthy lifestyle choices, including diet (rich in fruits, vegetables, and lean proteins, and low in salt and simple carbohydrates) and exercise (at least 30 minutes of moderate physical activity daily).     The above assessment and management plan was discussed with the patient. The patient verbalized understanding of and has agreed to the management plan. Patient is aware to call the clinic if they develop any new symptoms or if symptoms persist or worsen. Patient is aware when to return to the clinic for a follow-up visit. Patient educated on when it is appropriate to go to the emergency department.  No follow-ups on file.    Arrie Aran Santa Lighter, DNP [facility] [address] [place], [place] [address] [phone]

## 2023-06-19 ENCOUNTER — Ambulatory Visit (INDEPENDENT_AMBULATORY_CARE_PROVIDER_SITE_OTHER): Payer: Medicaid Other | Admitting: Nurse Practitioner

## 2023-06-19 ENCOUNTER — Encounter: Payer: Self-pay | Admitting: Nurse Practitioner

## 2023-06-19 VITALS — BP 138/79 | HR 93 | Temp 97.7°F | Ht 66.0 in | Wt 223.8 lb

## 2023-06-19 DIAGNOSIS — I10 Essential (primary) hypertension: Secondary | ICD-10-CM | POA: Diagnosis not present

## 2023-06-19 DIAGNOSIS — E785 Hyperlipidemia, unspecified: Secondary | ICD-10-CM | POA: Insufficient documentation

## 2023-08-27 ENCOUNTER — Other Ambulatory Visit: Payer: Self-pay | Admitting: Nurse Practitioner

## 2023-08-27 DIAGNOSIS — I10 Essential (primary) hypertension: Secondary | ICD-10-CM

## 2023-09-01 HISTORY — PX: DENTAL SURGERY: SHX609

## 2023-09-10 NOTE — Progress Notes (Signed)
Established Patient Office Visit  Subjective  Patient ID: Tammy Rush, female    DOB: [birth year]  Age: 43 y.o. MRN: [id number]  Chief Complaint  Patient presents with   Medical Management of Chronic Issues    3 month    HPI  Tammy Rush is a 43 yrs old present 09/16/2023 for 41-months f/u for chronic diseases management and concern for possible depression.   Depression new problem: Patient complains of depression. She complains of anhedonia, depressed mood, difficulty concentrating, fatigue, and insomnia. Onset was approximately 2 months ago, unchanged since that time.  She denies current suicidal and homicidal plan or intent.   Family history significant for no psychiatric illness.Possible organic causes contributing are: none.  Risk factors: none Previous treatment includes  none  and none.  Based on the PHQ-9 she is agreeable to start a low-dose of Lexapro   lipid/Cholesterol, Follow-up  Last lipid panel Other pertinent labs  Lab Results  Component Value Date   CHOL 202 (H) 05/14/2023   HDL 56 05/14/2023   LDLCALC 131 (H) 05/14/2023   TRIG 83 05/14/2023   CHOLHDL 3.6 05/14/2023   Lab Results  Component Value Date   ALT 15 05/14/2023   AST 13 05/14/2023   PLT 457 (H) 05/14/2023   TSH 0.901 05/14/2023     She was last seen for this 3 months ago.  Management since that visit includes lifestyle. She reports  she was suppose to try lifestyle modificatin  compliance with treatment. She reports that she has not been following healthy diet with it lipid today not in the normal range we will start a statin Symptoms: No chest pain No chest pressure/discomfort  No dyspnea No lower extremity edema  No numbness or tingling of extremity No orthopnea  No palpitations No paroxysmal nocturnal dyspnea  No speech difficulty No syncope   Current diet: in general, an "unhealthy" diet Current exercise: none will repeat lipid is no improvement wil  start medication  The  10-year ASCVD risk score (Arnett DK, et al., 2019) is: 2.1%  Hypertension, follow-up  BP Readings from Last 3 Encounters:  09/16/23 131/89  06/19/23 138/79  05/28/23 (!) 154/85   Wt Readings from Last 3 Encounters:  09/16/23 226 lb 3.2 oz (102.6 kg)  06/19/23 223 lb 12.8 oz (101.5 kg)  05/27/23 228 lb (103.4 kg)     She was last seen for hypertension 3 months ago.  BP at that visit was 138/79. Management since that visit includes Amlodipine and Atenelol.  She reports excellent compliance with treatment. She is not having side effects.  She is following a Regular diet. She is exercising. She does not vape.  Use of agents associated with hypertension: none.   Outside blood pressures are not being monitored. Symptoms: No chest pain No chest pressure  No palpitations No syncope  No dyspnea No orthopnea  No paroxysmal nocturnal dyspnea No lower extremity edema   Pertinent labs Lab Results  Component Value Date   CHOL 202 (H) 05/14/2023   HDL 56 05/14/2023   LDLCALC 131 (H) 05/14/2023   TRIG 83 05/14/2023   CHOLHDL 3.6 05/14/2023   Lab Results  Component Value Date   NA 139 05/14/2023   K 4.4 05/14/2023   CREATININE 0.68 05/14/2023   EGFR 111 05/14/2023   GLUCOSE 91 05/14/2023   TSH 0.901 05/14/2023     The 10-year ASCVD risk score (Arnett DK, et al., 2019) is: 2.1%  Obese: The patient  presents  with obesity, as indicated by a BMI of 36.51. The patient reports a history of gradual weight gain over the past 3 yrs, with no significant changes in diet or physical activity levels. The patient acknowledges that their current weight is impacting their overall health, and they are motivated to address the issue. They are planning to focus on improving their diet and incorporating regular exercise into their routine. The patient denies any recent significant changes in appetite, eating habits, or physical health other than the weight gain. They report no other chronic health  conditions, and there are no complaints of pain, fatigue, or shortness of breath.  The patient is aware of the risks associated with obesity, including cardiovascular and metabolic conditions, and is seeking guidance to improve their lifestyle and achieve weight loss goals. They are open to recommendations regarding dietary changes and exercise regimens.  Patient Active Problem List   Diagnosis Date Noted   Depression, major, single episode, moderate (HCC) 09/16/2023   Obesity, morbid (HCC) 09/16/2023   Hyperlipidemia 06/19/2023   Primary hypertension 05/14/2023   Routine medical exam 05/14/2023   Vitamin D deficiency 05/14/2023   Family history of diabetes mellitus in mother 05/14/2023   Acute diffuse otitis externa of right ear 05/14/2023   Past Medical History:  Diagnosis Date   Hypertension    Miscarriage    Thyroid disease    Past Surgical History:  Procedure Laterality Date   DENTAL SURGERY  09/01/2023   Social History   Tobacco Use   Smoking status: Former    Current packs/day: 0.00    Types: Cigarettes    Quit date: 09/03/2017    Years since quitting: 6.0   Smokeless tobacco: Never  Vaping Use   Vaping status: Some Days  Substance Use Topics   Alcohol use: No   Drug use: No   Social History   Socioeconomic History   Marital status: Single    Spouse name: Not on file   Number of children: 1   Years of education: Not on file   Highest education level: 12th grade  Occupational History   Not on file  Tobacco Use   Smoking status: Former    Current packs/day: 0.00    Types: Cigarettes    Quit date: 09/03/2017    Years since quitting: 6.0   Smokeless tobacco: Never  Vaping Use   Vaping status: Some Days  Substance and Sexual Activity   Alcohol use: No   Drug use: No   Sexual activity: Yes    Birth control/protection: Pill  Other Topics Concern   Not on file  Social History Narrative   Not on file   Social Drivers of Health   Financial Resource  Strain: Medium Risk (06/12/2023)   Overall Financial Resource Strain (CARDIA)    Difficulty of Paying Living Expenses: Somewhat hard  Food Insecurity: Food Insecurity Present (06/12/2023)   Hunger Vital Sign    Worried About Running Out of Food in the Last Year: Never true    Ran Out of Food in the Last Year: Sometimes true  Transportation Needs: No Transportation Needs (06/12/2023)   PRAPARE - Administrator, Civil Service (Medical): No    Lack of Transportation (Non-Medical): No  Physical Activity: Insufficiently Active (06/12/2023)   Exercise Vital Sign    Days of Exercise per Week: 3 days    Minutes of Exercise per Session: 20 min  Stress: No Stress Concern Present (06/12/2023)   [employer] of Occupational Health -  Occupational Stress Questionnaire    Feeling of Stress : Not at all  Social Connections: Moderately Isolated (06/12/2023)   Social Connection and Isolation Panel [NHANES]    Frequency of Communication with Friends and Family: More than three times a week    Frequency of Social Gatherings with Friends and Family: More than three times a week    Attends Religious Services: 1 to 4 times per year    Active Member of [employer] or Organizations: No    Attends Engineer, structural: Not on file    Marital Status: Never married  Catering manager Violence: Not on file   Family Status  Relation Name Status   Mother  Alive   Father  Alive  No partnership data on file   Family History  Problem Relation Age of Onset   Hypertension Mother    Diabetes Mother    Hypertension Father    No Known Allergies    Review of Systems  Constitutional:  Negative for chills and fever.  HENT:  Negative for ear pain and sinus pain.   Respiratory:  Negative for cough and shortness of breath.   Cardiovascular:  Negative for chest pain, palpitations and leg swelling.  Gastrointestinal:  Negative for diarrhea, nausea and vomiting.  Musculoskeletal:  Negative for  myalgias.  Skin:  Negative for itching and rash.  Neurological:  Negative for dizziness and headaches.  Endo/Heme/Allergies:  Negative for environmental allergies and polydipsia. Does not bruise/bleed easily.  Psychiatric/Behavioral:  Positive for depression. The patient has insomnia.    Negative unless indicated in HPI   Objective:     BP 131/89   Pulse 83   Temp 97.7 F (36.5 C) (Temporal)   Ht 5\' 6"  (1.676 m)   Wt 226 lb 3.2 oz (102.6 kg)   SpO2 100%   BMI 36.51 kg/m  BP Readings from Last 3 Encounters:  09/16/23 131/89  06/19/23 138/79  05/28/23 (!) 154/85   Wt Readings from Last 3 Encounters:  09/16/23 226 lb 3.2 oz (102.6 kg)  06/19/23 223 lb 12.8 oz (101.5 kg)  05/27/23 228 lb (103.4 kg)      Physical Exam Vitals and nursing note reviewed.  Constitutional:      Appearance: She is obese.  HENT:     Head: Normocephalic and atraumatic.     Nose: Nose normal.     Mouth/Throat:     Mouth: Mucous membranes are moist.  Eyes:     General: No scleral icterus.    Extraocular Movements: Extraocular movements intact.     Conjunctiva/sclera: Conjunctivae normal.     Pupils: Pupils are equal, round, and reactive to light.  Cardiovascular:     Rate and Rhythm: Normal rate and regular rhythm.  Pulmonary:     Effort: Pulmonary effort is normal.     Breath sounds: Normal breath sounds.  Musculoskeletal:        General: Normal range of motion.     Right lower leg: No edema.     Left lower leg: No edema.  Skin:    General: Skin is warm and dry.     Findings: No rash.  Neurological:     Mental Status: She is alert and oriented to person, place, and time.  Psychiatric:        Attention and Perception: Attention normal.        Mood and Affect: Mood is depressed.        Speech: Speech normal.  Behavior: Behavior normal. Behavior is cooperative.        Thought Content: Thought content normal.        Cognition and Memory: Cognition and memory normal.         Judgment: Judgment normal.      No results found for any visits on 09/16/23.  Last CBC Lab Results  Component Value Date   WBC 6.4 05/14/2023   HGB 12.4 05/14/2023   HCT 40.6 05/14/2023   MCV 76 (L) 05/14/2023   MCH 23.3 (L) 05/14/2023   RDW 15.8 (H) 05/14/2023   PLT 457 (H) 05/14/2023   Last metabolic panel Lab Results  Component Value Date   GLUCOSE 91 05/14/2023   NA 139 05/14/2023   K 4.4 05/14/2023   CL 100 05/14/2023   CO2 25 05/14/2023   BUN 10 05/14/2023   CREATININE 0.68 05/14/2023   EGFR 111 05/14/2023   CALCIUM 9.3 05/14/2023   PROT 6.8 05/14/2023   ALBUMIN 4.0 05/14/2023   LABGLOB 2.8 05/14/2023   BILITOT 0.3 05/14/2023   ALKPHOS 57 05/14/2023   AST 13 05/14/2023   ALT 15 05/14/2023   ANIONGAP 11 01/16/2020   Last lipids Lab Results  Component Value Date   CHOL 202 (H) 05/14/2023   HDL 56 05/14/2023   LDLCALC 131 (H) 05/14/2023   TRIG 83 05/14/2023   CHOLHDL 3.6 05/14/2023   Last hemoglobin A1c Lab Results  Component Value Date   HGBA1C 5.7 (H) 05/14/2023   Last thyroid functions Lab Results  Component Value Date   TSH 0.901 05/14/2023   T4TOTAL 10.1 05/14/2023        Assessment & Plan:  Depression, major, single episode, moderate (HCC)  Primary hypertension  Hyperlipidemia, unspecified hyperlipidemia type -     Lipid panel  Obesity, morbid (HCC)  Other orders -     Escitalopram Oxalate; Take 1 tablet (5 mg total) by mouth daily.  Dispense: 90 tablet; Refill: 0    Tammy Rush  is a67 year old African-American female seen today for chronic disease management and a new diagnosis of depression, no acute distress   Depresion New problem: will start her Lexapro 5 mg daily, will f/u in 5-weeks virtually for medication dose management  Hyperlipidemia: will recheck lipid if no changes, plan to start statin.  HTN: well controlled with amlodipine 5 mg daily and it atenolol 50 mg, refill provided  Obesity: client to work on diet and  exercise and get [employer] and keep a food diary intake and exercise log  Encourage healthy lifestyle choices, including diet (rich in fruits, vegetables, and lean proteins, and low in salt and simple carbohydrates) and exercise (at least 30 minutes of moderate physical activity daily).     The above assessment and management plan was discussed with the patient. The patient verbalized understanding of and has agreed to the management plan. Patient is aware to call the clinic if they develop any new symptoms or if symptoms persist or worsen. Patient is aware when to return to the clinic for a follow-up visit. Patient educated on when it is appropriate to go to the emergency department.  Return in about 3 months (around 12/15/2023) for chronic diseases management.    Arrie Aran Santa Lighter, [place] [facility] [address] [place], [place] [address] [phone]    Note: This document was prepared by [employer] voice dictation technology and any errors that results from this process are unintentional.

## 2023-09-16 ENCOUNTER — Ambulatory Visit: Payer: Medicaid Other | Admitting: Nurse Practitioner

## 2023-09-16 ENCOUNTER — Encounter: Payer: Self-pay | Admitting: Nurse Practitioner

## 2023-09-16 VITALS — BP 131/89 | HR 83 | Temp 97.7°F | Ht 66.0 in | Wt 226.2 lb

## 2023-09-16 DIAGNOSIS — I10 Essential (primary) hypertension: Secondary | ICD-10-CM | POA: Diagnosis not present

## 2023-09-16 DIAGNOSIS — F321 Major depressive disorder, single episode, moderate: Secondary | ICD-10-CM

## 2023-09-16 DIAGNOSIS — E785 Hyperlipidemia, unspecified: Secondary | ICD-10-CM | POA: Diagnosis not present

## 2023-09-16 MED ORDER — ESCITALOPRAM OXALATE 5 MG PO TABS: 5.0000 mg | ORAL_TABLET | Freq: Every day | ORAL | 0 refills | Status: DC

## 2023-09-17 LAB — LIPID PANEL
Chol/HDL Ratio: 4.8 {ratio} — ABNORMAL HIGH (ref 0.0–4.4)
Cholesterol, Total: 228 mg/dL — ABNORMAL HIGH (ref 100–199)
HDL: 48 mg/dL (ref >39)
LDL Chol Calc (NIH): 163 mg/dL — ABNORMAL HIGH (ref 0–99)
Triglycerides: 97 mg/dL (ref 0–149)
VLDL Cholesterol Cal: 17 mg/dL (ref 5–40)

## 2023-09-25 ENCOUNTER — Other Ambulatory Visit (HOSPITAL_COMMUNITY): Payer: Self-pay | Admitting: Nurse Practitioner

## 2023-09-25 DIAGNOSIS — D259 Leiomyoma of uterus, unspecified: Secondary | ICD-10-CM | POA: Diagnosis not present

## 2023-09-25 DIAGNOSIS — Z01419 Encounter for gynecological examination (general) (routine) without abnormal findings: Secondary | ICD-10-CM | POA: Diagnosis not present

## 2023-09-25 DIAGNOSIS — Z1151 Encounter for screening for human papillomavirus (HPV): Secondary | ICD-10-CM | POA: Diagnosis not present

## 2023-09-25 DIAGNOSIS — D219 Benign neoplasm of connective and other soft tissue, unspecified: Secondary | ICD-10-CM

## 2023-09-25 DIAGNOSIS — Z3041 Encounter for surveillance of contraceptive pills: Secondary | ICD-10-CM | POA: Diagnosis not present

## 2023-09-29 ENCOUNTER — Ambulatory Visit (HOSPITAL_COMMUNITY): Payer: Medicaid Other

## 2023-10-13 ENCOUNTER — Ambulatory Visit (HOSPITAL_COMMUNITY)
Admission: RE | Admit: 2023-10-13 | Discharge: 2023-10-13 | Disposition: A | Discharge status: Home / Self Care | Payer: Medicaid Other | Source: Ambulatory Visit | Attending: Nurse Practitioner | Admitting: Nurse Practitioner

## 2023-10-13 DIAGNOSIS — N854 Malposition of uterus: Secondary | ICD-10-CM | POA: Diagnosis not present

## 2023-10-13 DIAGNOSIS — D219 Benign neoplasm of connective and other soft tissue, unspecified: Secondary | ICD-10-CM | POA: Diagnosis present

## 2023-10-13 DIAGNOSIS — N83201 Unspecified ovarian cyst, right side: Secondary | ICD-10-CM | POA: Diagnosis not present

## 2023-10-13 DIAGNOSIS — D259 Leiomyoma of uterus, unspecified: Secondary | ICD-10-CM | POA: Diagnosis not present

## 2023-10-13 DIAGNOSIS — N852 Hypertrophy of uterus: Secondary | ICD-10-CM | POA: Diagnosis not present

## 2023-10-22 ENCOUNTER — Encounter: Payer: Self-pay | Admitting: Nurse Practitioner

## 2023-10-22 ENCOUNTER — Telehealth (INDEPENDENT_AMBULATORY_CARE_PROVIDER_SITE_OTHER): Payer: Medicaid Other | Admitting: Nurse Practitioner

## 2023-10-22 DIAGNOSIS — F321 Major depressive disorder, single episode, moderate: Secondary | ICD-10-CM | POA: Diagnosis not present

## 2023-10-22 MED ORDER — ESCITALOPRAM OXALATE 10 MG PO TABS: 10.0000 mg | ORAL_TABLET | Freq: Every day | ORAL | 0 refills | Status: DC

## 2023-10-22 NOTE — Progress Notes (Signed)
 Virtual Visit via video Note Due to COVID-19 pandemic this visit was conducted virtually. This visit type was conducted due to national recommendations for restrictions regarding the COVID-19 Pandemic (e.g. social distancing, sheltering in place) in an effort to limit this patient's exposure and mitigate transmission in our community. All issues noted in this document were discussed and addressed.  A physical exam was not performed with this format.   I connected with Tammy Rush on 10/22/2023 at 1200 by name and date of birth and verified that I am speaking with the correct person using two identifiers. Tammy Rush is currently located at home is currently with them during visit. The provider, Martina Sinner, DNP is located in their office at time of visit.  I discussed the limitations, risks, security and privacy concerns of performing an evaluation and management service by virtual visit and the availability of in person appointments. I also discussed with the patient that there may be a patient responsible charge related to this service. The patient expressed understanding and agreed to proceed.  Subjective:  Patient ID: Tammy Rush, female    DOB: [birth year], 43 y.o.   MRN: [id number]  Chief Complaint:  Depression (" Was doing good up until yesterday find out that ex lied and currently living with a new woman")   HPI: Tammy Rush is a 43 y.o. female presenting on 10/22/2023 for Depression (" Was doing good up until yesterday find out that ex lied and currently living with a new woman")  Depression, Follow-up  She  was last seen for this 5 weeks ago. Changes made at last visit include Lexapro 5 mg.   She reports excellent compliance with treatment. She is not having side effects.   She reports excellent tolerance of treatment. Current symptoms include: depressed mood and difficulty concentrating She feels she is Improved since last visit.     09/16/2023     8:13 AM 06/19/2023    2:42 PM 05/14/2023   10:03 AM  Depression screen PHQ 2/9  Decreased Interest 3 0 0  Down, Depressed, Hopeless 3 0 0  PHQ - 2 Score 6 0 0  Altered sleeping 3 0 0  Tired, decreased energy 1 0 1  Change in appetite 0 0 0  Feeling bad or failure about yourself  0 0 0  Trouble concentrating 0 0 0  Moving slowly or fidgety/restless 0 0 0  Suicidal thoughts 0 0 0  PHQ-9 Score 10 0 1  Difficult doing work/chores Somewhat difficult Not difficult at all Not difficult at all    Relevant past medical, surgical, family, and social history reviewed and updated as indicated.  Allergies and medications reviewed and updated.   Past Medical History:  Diagnosis Date   Hypertension    Miscarriage    Thyroid disease     Past Surgical History:  Procedure Laterality Date   DENTAL SURGERY  09/01/2023    Social History   Socioeconomic History   Marital status: Single    Spouse name: Not on file   Number of children: 1   Years of education: Not on file   Highest education level: 12th grade  Occupational History   Not on file  Tobacco Use   Smoking status: Former    Current packs/day: 0.00    Types: Cigarettes    Quit date: 09/03/2017    Years since quitting: 6.1   Smokeless tobacco: Never  Vaping Use   Vaping status: Some Days  Substance and Sexual Activity   Alcohol use: No   Drug use: No   Sexual activity: Yes    Birth control/protection: Pill  Other Topics Concern   Not on file  Social History Narrative   Not on file   Social Drivers of Health   Financial Resource Strain: Medium Risk (06/12/2023)   Overall Financial Resource Strain (CARDIA)    Difficulty of Paying Living Expenses: Somewhat hard  Food Insecurity: Food Insecurity Present (06/12/2023)   Hunger Vital Sign    Worried About Running Out of Food in the Last Year: Never true    Ran Out of Food in the Last Year: Sometimes true  Transportation Needs: No Transportation Needs (06/12/2023)    PRAPARE - Administrator, Civil Service (Medical): No    Lack of Transportation (Non-Medical): No  Physical Activity: Insufficiently Active (06/12/2023)   Exercise Vital Sign    Days of Exercise per Week: 3 days    Minutes of Exercise per Session: 20 min  Stress: No Stress Concern Present (06/12/2023)   [employer] of Occupational Health - Occupational Stress Questionnaire    Feeling of Stress : Not at all  Social Connections: Moderately Isolated (06/12/2023)   Social Connection and Isolation Panel [NHANES]    Frequency of Communication with Friends and Family: More than three times a week    Frequency of Social Gatherings with Friends and Family: More than three times a week    Attends Religious Services: 1 to 4 times per year    Active Member of [employer] or Organizations: No    Attends Banker Meetings: Not on file    Marital Status: Never married  Intimate Partner Violence: Not on file    Outpatient Encounter Medications as of 10/22/2023  Medication Sig   escitalopram (LEXAPRO) 10 MG tablet Take 1 tablet (10 mg total) by mouth daily.   amLODipine (NORVASC) 5 MG tablet TAKE 1 TABLET (5 MG TOTAL) BY MOUTH DAILY.   atenolol (TENORMIN) 50 MG tablet Take 50 mg by mouth daily.   cholecalciferol (VITAMIN D3) 25 MCG (1000 UNIT) tablet Take 5,000 Units by mouth daily.   diphenhydrAMINE (BENADRYL) 25 MG tablet Take 1 tablet (25 mg total) by mouth every 6 (six) hours.   norethindrone (MICRONOR) 0.35 MG tablet Take 1 tablet by mouth daily.   [DISCONTINUED] escitalopram (LEXAPRO) 5 MG tablet Take 1 tablet (5 mg total) by mouth daily.   No facility-administered encounter medications on file as of 10/22/2023.    No Known Allergies  Review of Systems  Constitutional:  Negative for chills and fatigue.  HENT:  Negative for congestion, sinus pressure and sore throat.   Respiratory:  Negative for apnea, cough and shortness of breath.   Cardiovascular:  Negative for chest  pain and leg swelling.  Genitourinary:  Negative for dysuria and enuresis.  Musculoskeletal:  Negative for myalgias.  Skin:  Negative for pallor and rash.  Allergic/Immunologic: Negative for environmental allergies, food allergies and immunocompromised state.  Neurological:  Negative for dizziness and headaches.  Psychiatric/Behavioral:  Positive for decreased concentration, sleep disturbance and suicidal ideas. Negative for agitation, behavioral problems, hallucinations and self-injury. The patient is nervous/anxious.    Observations/Objective: No vital signs or physical exam, this was a virtual health encounter.  Pt alert and oriented, answers all questions appropriately, and able to speak in full sentences.    Assessment and Plan: Tammy Rush was seen today for depression.  Diagnoses and all orders for this visit:  Depression, major, single episode, moderate (HCC) -     escitalopram (LEXAPRO) 10 MG tablet; Take 1 tablet (10 mg total) by mouth daily.   Tammy Rush is a 43 year old African-American female seen today via video visit for depression, no acute distress Depression: Plan to increase Lexapro to 10 mg daily Client encouraged to rely on current support system, family and her church; cut ties with ex Practice journaling, deep breathing and meditation to relieve stress All question answered Last labs discussed client's advised to take daily fish oil to improve lipid, will check recheck lipid at next follow-up appointment  Encourage healthy lifestyle choices, including diet (rich in fruits, vegetables, and lean proteins, and low in salt and simple carbohydrates) and exercise (at least 30 minutes of moderate physical activity daily).     The above assessment and management plan was discussed with the patient. The patient verbalized understanding of and has agreed to the management plan. Patient is aware to call the clinic if they develop any new symptoms or if symptoms persist or worsen.  Patient is aware when to return to the clinic for a follow-up visit. Patient educated on when it is appropriate to go to the emergency department.   Follow Up Instructions: Return for as already scheduled.    I discussed the assessment and treatment plan with the patient. The patient was provided an opportunity to ask questions and all were answered. The patient agreed with the plan and demonstrated an understanding of the instructions.   The patient was advised to call back or seek an in-person evaluation if the symptoms worsen or if the condition fails to improve as anticipated.  The above assessment and management plan was discussed with the patient. The patient verbalized understanding of and has agreed to the management plan. Patient is aware to call the clinic if they develop any new symptoms or if symptoms persist or worsen. Patient is aware when to return to the clinic for a follow-up visit. Patient educated on when it is appropriate to go to the emergency department.    I provided 17 minutes of time during this video encounter.   Arrie Aran Santa Lighter, DNP [facility] [address] [place], [place] [address] [phone] 10/22/2023

## 2023-11-27 ENCOUNTER — Emergency Department (HOSPITAL_COMMUNITY)
Admission: EM | Admit: 2023-11-27 | Discharge: 2023-11-27 | Disposition: A | Discharge status: Home / Self Care | Attending: Emergency Medicine | Admitting: Emergency Medicine

## 2023-11-27 ENCOUNTER — Other Ambulatory Visit: Payer: Self-pay

## 2023-11-27 ENCOUNTER — Encounter (HOSPITAL_COMMUNITY): Payer: Self-pay

## 2023-11-27 ENCOUNTER — Emergency Department (HOSPITAL_COMMUNITY)

## 2023-11-27 DIAGNOSIS — R519 Headache, unspecified: Secondary | ICD-10-CM | POA: Diagnosis not present

## 2023-11-27 DIAGNOSIS — R231 Pallor: Secondary | ICD-10-CM | POA: Diagnosis not present

## 2023-11-27 DIAGNOSIS — E860 Dehydration: Secondary | ICD-10-CM | POA: Insufficient documentation

## 2023-11-27 DIAGNOSIS — R55 Syncope and collapse: Secondary | ICD-10-CM | POA: Diagnosis not present

## 2023-11-27 DIAGNOSIS — I959 Hypotension, unspecified: Secondary | ICD-10-CM | POA: Diagnosis not present

## 2023-11-27 DIAGNOSIS — Z79899 Other long term (current) drug therapy: Secondary | ICD-10-CM | POA: Diagnosis not present

## 2023-11-27 DIAGNOSIS — R0689 Other abnormalities of breathing: Secondary | ICD-10-CM | POA: Diagnosis not present

## 2023-11-27 DIAGNOSIS — I10 Essential (primary) hypertension: Secondary | ICD-10-CM | POA: Insufficient documentation

## 2023-11-27 LAB — COMPREHENSIVE METABOLIC PANEL WITH GFR
ALT: 16 U/L (ref 0–44)
AST: 15 U/L (ref 15–41)
Albumin: 3.9 g/dL (ref 3.5–5.0)
Alkaline Phosphatase: 51 U/L (ref 38–126)
Anion gap: 9 (ref 5–15)
BUN: 11 mg/dL (ref 6–20)
CO2: 25 mmol/L (ref 22–32)
Calcium: 9.2 mg/dL (ref 8.9–10.3)
Chloride: 101 mmol/L (ref 98–111)
Creatinine, Ser: 0.73 mg/dL (ref 0.44–1.00)
GFR, Estimated: >60 mL/min (ref >60)
Glucose, Bld: 105 mg/dL — ABNORMAL HIGH (ref 70–99)
Potassium: 3.8 mmol/L (ref 3.5–5.1)
Sodium: 135 mmol/L (ref 135–145)
Total Bilirubin: 0.9 mg/dL (ref 0.0–1.2)
Total Protein: 7.8 g/dL (ref 6.5–8.1)

## 2023-11-27 LAB — CBC WITH DIFFERENTIAL/PLATELET
Abs Immature Granulocytes: 0.02 10*3/uL (ref 0.00–0.07)
Basophils Absolute: 0 10*3/uL (ref 0.0–0.1)
Basophils Relative: 0 %
Eosinophils Absolute: 0.1 10*3/uL (ref 0.0–0.5)
Eosinophils Relative: 1 %
HCT: 42.4 % (ref 36.0–46.0)
Hemoglobin: 13.5 g/dL (ref 12.0–15.0)
Immature Granulocytes: 0 %
Lymphocytes Relative: 6 %
Lymphs Abs: 0.6 10*3/uL — ABNORMAL LOW (ref 0.7–4.0)
MCH: 23.9 pg — ABNORMAL LOW (ref 26.0–34.0)
MCHC: 31.8 g/dL (ref 30.0–36.0)
MCV: 74.9 fL — ABNORMAL LOW (ref 80.0–100.0)
Monocytes Absolute: 0.6 10*3/uL (ref 0.1–1.0)
Monocytes Relative: 6 %
Neutro Abs: 9 10*3/uL — ABNORMAL HIGH (ref 1.7–7.7)
Neutrophils Relative %: 87 %
Platelets: 444 10*3/uL — ABNORMAL HIGH (ref 150–400)
RBC: 5.66 MIL/uL — ABNORMAL HIGH (ref 3.87–5.11)
RDW: 15.2 % (ref 11.5–15.5)
WBC: 10.3 10*3/uL (ref 4.0–10.5)
nRBC: 0 % (ref 0.0–0.2)

## 2023-11-27 MED ORDER — SODIUM CHLORIDE 0.9 % IV BOLUS
1000.0000 mL | Freq: Once | INTRAVENOUS | Status: AC
  Administered 2023-11-27: 1000 mL via INTRAVENOUS

## 2023-11-27 NOTE — Discharge Instructions (Signed)
 Plenty of fluids.  Follow-up next week if not improving return if any problems

## 2023-11-27 NOTE — ED Triage Notes (Signed)
 BIB EMS states pt went home for lunch from work. Went to the bathroom, son heard a thump. She had "blacked out" in bathroom and son called EMS. Per EMS, BP was around 70/40 but after bolus was 104/70. Pt A&Ox4 but states she did hit her head in the fall and has a little headache presently. Hx of HTN, CBG 120 per EMS.

## 2023-11-27 NOTE — ED Provider Notes (Signed)
 Tammy Rush   CSN: [id number] Arrival date & time: 11/27/23  1445     History  Chief Complaint  Patient presents with   syncopal episode   Hypotension    Tammy Rush is a 43 y.o. female.  Patient has a history of having diarrhea today.  She also has hypertension.  Patient was in the bathroom and had syncopal episode.  She feels fine now  The history is provided by the patient and medical records. No language interpreter was used.  Loss of Consciousness Episode history:  Single Most recent episode:  Today Timing:  Intermittent Progression:  Resolved Chronicity:  New Context: not blood draw   Witnessed: no   Relieved by:  Nothing Worsened by:  Nothing Ineffective treatments:  None tried Associated symptoms: dizziness   Associated symptoms: no anxiety, no chest pain, no headaches and no seizures        Home Medications Prior to Admission medications   Medication Sig Start Date End Date Taking? Authorizing Provider  amLODipine (NORVASC) 5 MG tablet TAKE 1 TABLET (5 MG TOTAL) BY MOUTH DAILY. 08/27/23   St Annice Kim, NP  atenolol (TENORMIN) 50 MG tablet Take 50 mg by mouth daily.    [provider]  D-5000 125 MCG (5000 UT) TABS Take 1 tablet by mouth daily. 11/03/23   [provider]  diphenhydrAMINE (BENADRYL) 25 MG tablet Take 1 tablet (25 mg total) by mouth every 6 (six) hours. 05/27/23   Edson Graces, MD  escitalopram (LEXAPRO) 10 MG tablet Take 1 tablet (10 mg total) by mouth daily. 10/22/23   St Annice Kim, NP  HYDROcodone-acetaminophen (NORCO/VICODIN) 5-325 MG tablet Take 1 tablet by mouth every 4 (four) hours as needed. 09/01/23   [provider]  norethindrone (MICRONOR) 0.35 MG tablet Take 1 tablet by mouth daily.    [provider]      Allergies    Patient has no known allergies.    Review of Systems   Review of Systems   Constitutional:  Negative for appetite change and fatigue.  HENT:  Negative for congestion, ear discharge and sinus pressure.   Eyes:  Negative for discharge.  Respiratory:  Negative for cough.   Cardiovascular:  Positive for syncope. Negative for chest pain.  Gastrointestinal:  Positive for diarrhea. Negative for abdominal pain.  Genitourinary:  Negative for frequency and hematuria.  Musculoskeletal:  Negative for back pain.  Skin:  Negative for rash.  Neurological:  Positive for dizziness. Negative for seizures and headaches.  Psychiatric/Behavioral:  Negative for hallucinations.     Physical Exam Updated Vital Signs BP 117/68   Pulse 69   Temp 97.7 F (36.5 C) (Oral)   Resp 17   Ht 5\' 6"  (1.676 m)   Wt 98.9 kg   LMP 11/18/2023 (Approximate)   SpO2 100%   BMI 35.19 kg/m  Physical Exam Vitals and nursing Rush reviewed.  Constitutional:      Appearance: She is well-developed.  HENT:     Head: Normocephalic.     Nose: Nose normal.  Eyes:     General: No scleral icterus.    Conjunctiva/sclera: Conjunctivae normal.  Neck:     Thyroid: No thyromegaly.  Cardiovascular:     Rate and Rhythm: Normal rate and regular rhythm.     Heart sounds: No murmur heard.    No friction rub. No gallop.  Pulmonary:     Breath sounds:  No stridor. No wheezing or rales.  Chest:     Chest wall: No tenderness.  Abdominal:     General: There is no distension.     Tenderness: There is no abdominal tenderness. There is no rebound.  Musculoskeletal:        General: Normal range of motion.     Cervical back: Neck supple.  Lymphadenopathy:     Cervical: No cervical adenopathy.  Skin:    Findings: No erythema or rash.  Neurological:     Mental Status: She is alert and oriented to person, place, and time.     Motor: No abnormal muscle tone.     Coordination: Coordination normal.  Psychiatric:        Behavior: Behavior normal.     ED Results / Procedures / Treatments   Labs (all labs  ordered are listed, but only abnormal results are displayed) Labs Reviewed  CBC WITH DIFFERENTIAL/PLATELET - Abnormal; Notable for the following components:      Result Value   RBC 5.66 (*)    MCV 74.9 (*)    MCH 23.9 (*)    Platelets 444 (*)    Neutro Abs 9.0 (*)    Lymphs Abs 0.6 (*)    All other components within normal limits  COMPREHENSIVE METABOLIC PANEL WITH GFR - Abnormal; Notable for the following components:   Glucose, Bld 105 (*)    All other components within normal limits    EKG EKG Interpretation Date/Time:  Thursday November 27 2023 14:55:33 EDT Ventricular Rate:  50 PR Interval:  198 QRS Duration:  97 QT Interval:  465 QTC Calculation: 424 R Axis:   17  Text Interpretation: Sinus rhythm Abnormal R-wave progression, early transition Borderline T abnormalities, anterior leads Confirmed by Cheyenne Cotta [phone]) on 11/27/2023 3:32:06 PM  Radiology CT Head Wo Contrast Result Date: 11/27/2023 CLINICAL DATA:  Syncope/presyncope. EXAM: CT HEAD WITHOUT CONTRAST TECHNIQUE: Contiguous axial images were obtained from the base of the skull through the vertex without intravenous contrast. RADIATION DOSE REDUCTION: This exam was performed according to the departmental dose-optimization program which includes automated exposure control, adjustment of the mA and/or kV according to patient size and/or use of iterative reconstruction technique. COMPARISON:  None Available. FINDINGS: Brain: No acute intracranial hemorrhage. No CT evidence of acute infarct. No edema, mass effect, or midline shift. The basilar cisterns are patent. Ventricles: The ventricles are normal. Vascular: No hyperdense vessel or unexpected calcification. Skull: No acute or aggressive finding. Orbits: Orbits are symmetric. Sinuses: Mild mucosal thickening in the bilateral ethmoid sinuses. Other: Mastoid air cells are clear. IMPRESSION: No CT evidence of acute intracranial abnormality. Electronically Signed   By: Denny Flack M.D.   On: 11/27/2023 18:09    Procedures Procedures    Medications Ordered in ED Medications  sodium chloride 0.9 % bolus 1,000 mL (1,000 mLs Intravenous New Bag/Given 11/27/23 1639)    ED Course/ Medical Decision Making/ A&P                                 Medical Decision Making Amount and/or Complexity of Data Reviewed Labs: ordered. Radiology: ordered.   With gastroenteritis, hypertension, dehydration which caused syncope.  Patient improved with IV fluids.  She will follow-up with her PCP as needed       Final Clinical Impression(s) / ED Diagnoses Final diagnoses:  Dehydration  Syncope, unspecified syncope type    Rx /  DC Orders ED Discharge Orders     None         Cheyenne Cotta, MD 11/29/23 1201

## 2023-11-28 ENCOUNTER — Telehealth: Payer: Self-pay

## 2023-11-28 NOTE — Telephone Encounter (Signed)
 Patient aware, appointment scheduled with PCP for Monday 12/01/23.

## 2023-11-28 NOTE — Telephone Encounter (Signed)
 Copied from CRM [id number]. Topic: Clinical - Medical Advice >> Nov 28, 2023 12:52 PM Gery Pray wrote: Reason for CRM: Patient had to be rushed to the hospital for passing out. When checking her BP at the hospital levels were dangerously low. Patient would like to know if she should continue taking the BP medicine or what should she do. Please contact patient at [phone].

## 2023-11-29 NOTE — Progress Notes (Unsigned)
   Established Patient Office Visit  Subjective  Patient ID: Tammy Rush, female    DOB: 28-Nov-1980  Age: 43 y.o. MRN: [id number]  No chief complaint on file.   HPI  {History (Optional):23778}  ROS Negative unless indicated in HPI   Objective:     LMP 11/18/2023 (Approximate)  {Vitals History (Optional):23777}  Physical Exam   No results found for any visits on 12/01/23.  {Labs (Optional):23779}    Assessment & Plan:  There are no diagnoses linked to this encounter.  No follow-ups on file.    @Merel Santoli  Hildy Lowers, [place]    Note: This document was prepared by [employer] voice dictation technology and any errors that results from this process are unintentional.

## 2023-12-01 ENCOUNTER — Ambulatory Visit: Admitting: Nurse Practitioner

## 2023-12-01 ENCOUNTER — Encounter: Payer: Self-pay | Admitting: Nurse Practitioner

## 2023-12-01 VITALS — BP 166/88 | HR 67 | Temp 98.3°F | Ht 66.0 in | Wt 221.0 lb

## 2023-12-01 DIAGNOSIS — Z09 Encounter for follow-up examination after completed treatment for conditions other than malignant neoplasm: Secondary | ICD-10-CM | POA: Diagnosis not present

## 2023-12-01 DIAGNOSIS — K529 Noninfective gastroenteritis and colitis, unspecified: Secondary | ICD-10-CM | POA: Diagnosis not present

## 2023-12-01 DIAGNOSIS — I10 Essential (primary) hypertension: Secondary | ICD-10-CM

## 2023-12-01 DIAGNOSIS — R55 Syncope and collapse: Secondary | ICD-10-CM | POA: Diagnosis not present

## 2023-12-01 DIAGNOSIS — E860 Dehydration: Secondary | ICD-10-CM

## 2023-12-15 ENCOUNTER — Encounter: Payer: Self-pay | Admitting: Nurse Practitioner

## 2023-12-15 ENCOUNTER — Ambulatory Visit: Payer: Medicaid Other | Admitting: Nurse Practitioner

## 2023-12-15 VITALS — BP 151/90 | HR 58 | Temp 97.5°F | Ht 66.0 in | Wt 218.2 lb

## 2023-12-15 DIAGNOSIS — E782 Mixed hyperlipidemia: Secondary | ICD-10-CM | POA: Diagnosis not present

## 2023-12-15 DIAGNOSIS — F321 Major depressive disorder, single episode, moderate: Secondary | ICD-10-CM | POA: Diagnosis not present

## 2023-12-15 DIAGNOSIS — I10 Essential (primary) hypertension: Secondary | ICD-10-CM

## 2023-12-15 DIAGNOSIS — E559 Vitamin D deficiency, unspecified: Secondary | ICD-10-CM

## 2023-12-15 DIAGNOSIS — D509 Iron deficiency anemia, unspecified: Secondary | ICD-10-CM

## 2023-12-15 MED ORDER — LOSARTAN POTASSIUM-HCTZ 50-12.5 MG PO TABS: 1.0000 | ORAL_TABLET | Freq: Every day | ORAL | 0 refills | Status: DC

## 2023-12-15 NOTE — Progress Notes (Unsigned)
 Established Patient Office Visit  Subjective  Patient ID: Tammy Rush, female    DOB: 1981-04-26  Age: 43 y.o. MRN: [id number]  Chief Complaint  Patient presents with   Medical Management of Chronic Issues    3 month    HPI Tammy Rush is a 43 year old female presents December 15, 2023 3 months follow-up for chronic disease management, no acute distress  Anemia  the patient is a -year-old  with a history of anemia, currently managed with daily iron supplements. Reports feeling less fatigued and has noticed an improvement in her energy levels. Recent lab results show a rise in hemoglobin levels, indicating positive response to treatment. Continues to follow her prescribed regimen and attends regular follow-up appointments.    Hypertension, follow-up  BP Readings from Last 3 Encounters:  12/15/23 (!) 151/90  12/01/23 (!) 166/88  11/27/23 117/68   Wt Readings from Last 3 Encounters:  12/15/23 218 lb 3.2 oz (99 kg)  12/01/23 221 lb (100.2 kg)  11/27/23 218 lb (98.9 kg)     She was last seen for hypertension 3 months ago.  BP at that visit was 168/88. Management since that visit includes amlodipine  5 mg and.Atenolol 50 mg  She reports excellent compliance with treatment. She is not having side effects.  She is following a  diet. She  exercising. She  smoke.  Use of agents associated with hypertension: none.   Outside blood pressures  130/84 Symptoms: No chest pain No chest pressure  No palpitations No syncope  No dyspnea No orthopnea  No paroxysmal nocturnal dyspnea No lower extremity edema   Pertinent labs Lab Results  Component Value Date   CHOL 228 (H) 09/16/2023   HDL 48 09/16/2023   LDLCALC 163 (H) 09/16/2023   TRIG 97 09/16/2023   CHOLHDL 4.8 (H) 09/16/2023   Lab Results  Component Value Date   NA 135 11/27/2023   K 3.8 11/27/2023   CREATININE 0.73 11/27/2023   GFRNONAA >60 11/27/2023   GLUCOSE 105 (H) 11/27/2023   TSH 0.901 05/14/2023      The 10-year ASCVD risk score (Arnett DK, et al., 2019) is: 7%  Lipid/Cholesterol, Follow-up  Last lipid panel Other pertinent labs  Lab Results  Component Value Date   CHOL 228 (H) 09/16/2023   HDL 48 09/16/2023   LDLCALC 163 (H) 09/16/2023   TRIG 97 09/16/2023   CHOLHDL 4.8 (H) 09/16/2023   Lab Results  Component Value Date   ALT 16 11/27/2023   AST 15 11/27/2023   PLT 423 12/16/2023   TSH 0.901 05/14/2023     She was last seen for this 3 months ago.  Management since that visit includes Lipitor 10 mg daily.  She reports excellent compliance with treatment. She is not having side effects.   Symptoms: No chest pain No chest pressure/discomfort  No dyspnea No lower extremity edema  No numbness or tingling of extremity No orthopnea  No palpitations No paroxysmal nocturnal dyspnea  No speech difficulty No syncope   Current diet: in general, an "unhealthy" diet Current exercise: none  The 10-year ASCVD risk score (Arnett DK, et al., 2019) is: 7%  Depression, Follow-up  She  was last seen for this 3 months ago. Changes made at last visit include Lexapro  10 mg daily.   She reports excellent compliance with treatment. She is not having side effects.   She reports excellent tolerance of treatment. Current symptoms include: weight gain She feels she is Improved since last  visit.     12/01/2023   11:33 AM 09/16/2023    8:13 AM 06/19/2023    2:42 PM  Depression screen PHQ 2/9  Decreased Interest 0 3 0  Down, Depressed, Hopeless 0 3 0  PHQ - 2 Score 0 6 0  Altered sleeping 0 3 0  Tired, decreased energy 0 1 0  Change in appetite 0 0 0  Feeling bad or failure about yourself  0 0 0  Trouble concentrating 0 0 0  Moving slowly or fidgety/restless 0 0 0  Suicidal thoughts 0 0 0  PHQ-9 Score 0 10 0  Difficult doing work/chores Not difficult at all Somewhat difficult Not difficult at all    Vitamin D  deficiency, follow-up  Lab Results  Component Value Date    VD25OH 47.1 05/14/2023   CALCIUM 9.2 11/27/2023   CALCIUM 9.3 05/14/2023        Wt Readings from Last 3 Encounters:  12/15/23 218 lb 3.2 oz (99 kg)  12/01/23 221 lb (100.2 kg)  11/27/23 218 lb (98.9 kg)   She was last seen for vitamin D  deficiency 3 months ago.  Management since that visit includes vitamin D  50,000 unit weekly. She reports excellent compliance with treatment. She is not having side effects.   Symptoms: Yes change in energy level No numbness or tingling  No bone pain No unexplained fracture   Obesity: Patient complains of obesity. Patient cites health as reasons for wanting to lose weight.  Patient Active Problem List   Diagnosis Date Noted   Hospital discharge follow-up 12/01/2023   Dehydration 12/01/2023   Syncope 12/01/2023   Gastroenteritis 12/01/2023   Depression, major, single episode, moderate (HCC) 09/16/2023   Obesity, morbid (HCC) 09/16/2023   Hyperlipidemia 06/19/2023   Primary hypertension 05/14/2023   Routine medical exam 05/14/2023   Vitamin D  deficiency 05/14/2023   Family history of diabetes mellitus in mother 05/14/2023   Acute diffuse otitis externa of right ear 05/14/2023   Past Medical History:  Diagnosis Date   Hypertension    Miscarriage    Thyroid  disease    Past Surgical History:  Procedure Laterality Date   DENTAL SURGERY  09/01/2023   Social History   Tobacco Use   Smoking status: Former    Current packs/day: 0.00    Types: Cigarettes    Quit date: 09/03/2017    Years since quitting: 6.2   Smokeless tobacco: Never  Vaping Use   Vaping status: Some Days  Substance Use Topics   Alcohol use: No   Drug use: No   Social History   Socioeconomic History   Marital status: Single    Spouse name: Not on file   Number of children: 1   Years of education: Not on file   Highest education level: 12th grade  Occupational History   Not on file  Tobacco Use   Smoking status: Former    Current packs/day: 0.00    Types:  Cigarettes    Quit date: 09/03/2017    Years since quitting: 6.2   Smokeless tobacco: Never  Vaping Use   Vaping status: Some Days  Substance and Sexual Activity   Alcohol use: No   Drug use: No   Sexual activity: Yes    Birth control/protection: Pill  Other Topics Concern   Not on file  Social History Narrative   Not on file   Social Drivers of Health   Financial Resource Strain: Medium Risk (06/12/2023)   Overall Physicist, medical Strain (  CARDIA)    Difficulty of Paying Living Expenses: Somewhat hard  Food Insecurity: Food Insecurity Present (06/12/2023)   Hunger Vital Sign    Worried About Running Out of Food in the Last Year: Never true    Ran Out of Food in the Last Year: Sometimes true  Transportation Needs: No Transportation Needs (06/12/2023)   PRAPARE - Administrator, Civil Service (Medical): No    Lack of Transportation (Non-Medical): No  Physical Activity: Insufficiently Active (06/12/2023)   Exercise Vital Sign    Days of Exercise per Week: 3 days    Minutes of Exercise per Session: 20 min  Stress: No Stress Concern Present (06/12/2023)   [employer] of Occupational Health - Occupational Stress Questionnaire    Feeling of Stress : Not at all  Social Connections: Moderately Isolated (06/12/2023)   Social Connection and Isolation Panel [NHANES]    Frequency of Communication with Friends and Family: More than three times a week    Frequency of Social Gatherings with Friends and Family: More than three times a week    Attends Religious Services: 1 to 4 times per year    Active Member of [employer] or Organizations: No    Attends Engineer, structural: Not on file    Marital Status: Never married  Catering manager Violence: Not on file   Family Status  Relation Name Status   Mother  Alive   Father  Alive  No partnership data on file   Family History  Problem Relation Age of Onset   Hypertension Mother    Diabetes Mother     Hypertension Father    No Known Allergies    Review of Systems  Constitutional:  Negative for chills and fever.  HENT:  Negative for sore throat.   Respiratory:  Negative for cough, shortness of breath and stridor.   Cardiovascular:  Negative for chest pain and leg swelling.  Gastrointestinal:  Negative for blood in stool, constipation, nausea and vomiting.  Skin:  Negative for itching and rash.  Neurological:  Negative for dizziness and headaches.   Negative unless indicated in HPI   Objective:     BP (!) 151/90   Pulse (!) 58   Temp (!) 97.5 F (36.4 C) (Temporal)   Ht 5\' 6"  (1.676 m)   Wt 218 lb 3.2 oz (99 kg)   LMP 11/18/2023 (Approximate)   SpO2 99%   BMI 35.22 kg/m  BP Readings from Last 3 Encounters:  12/15/23 (!) 151/90  12/01/23 (!) 166/88  11/27/23 117/68   Wt Readings from Last 3 Encounters:  12/15/23 218 lb 3.2 oz (99 kg)  12/01/23 221 lb (100.2 kg)  11/27/23 218 lb (98.9 kg)      Physical Exam Vitals and nursing note reviewed.  HENT:     Head: Normocephalic and atraumatic.     Nose: Nose normal.  Eyes:     Extraocular Movements: Extraocular movements intact.     Pupils: Pupils are equal, round, and reactive to light.  Cardiovascular:     Heart sounds: Normal heart sounds.  Pulmonary:     Effort: Pulmonary effort is normal.     Breath sounds: Normal breath sounds.  Abdominal:     General: Bowel sounds are normal.     Palpations: Abdomen is soft.  Musculoskeletal:        General: Normal range of motion.     Right lower leg: No edema.     Left lower leg: No  edema.  Skin:    General: Skin is warm and dry.  Neurological:     Mental Status: She is oriented to person, place, and time.  Psychiatric:        Mood and Affect: Mood normal.        Behavior: Behavior normal.        Thought Content: Thought content normal.        Judgment: Judgment normal.      Results for orders placed or performed in visit on 12/15/23  Anemia Profile B   Result Value Ref Range   Total Iron Binding Capacity 390 250 - 450 ug/dL   UIBC 161 096 - 045 ug/dL   Iron 44 27 - 409 ug/dL   Iron Saturation 11 (L) 15 - 55 %   Ferritin 34 15 - 150 ng/mL   Vitamin B-12 358 232 - 1,245 pg/mL   Folate 6.3 >3.0 ng/mL   WBC 6.0 3.4 - 10.8 x10E3/uL   RBC 5.19 3.77 - 5.28 x10E6/uL   Hemoglobin 12.0 11.1 - 15.9 g/dL   Hematocrit 81.1 91.4 - 46.6 %   MCV 76 (L) 79 - 97 fL   MCH 23.1 (L) 26.6 - 33.0 pg   MCHC 30.5 (L) 31.5 - 35.7 g/dL   RDW 78.2 (H) 95.6 - 21.3 %   Platelets 423 150 - 450 x10E3/uL   Neutrophils 64 Not Estab. %   Lymphs 28 Not Estab. %   Monocytes 6 Not Estab. %   Eos 2 Not Estab. %   Basos 0 Not Estab. %   Neutrophils Absolute 3.8 1.4 - 7.0 x10E3/uL   Lymphocytes Absolute 1.7 0.7 - 3.1 x10E3/uL   Monocytes Absolute 0.4 0.1 - 0.9 x10E3/uL   EOS (ABSOLUTE) 0.1 0.0 - 0.4 x10E3/uL   Basophils Absolute 0.0 0.0 - 0.2 x10E3/uL   Immature Granulocytes 0 Not Estab. %   Immature Grans (Abs) 0.0 0.0 - 0.1 x10E3/uL   Retic Ct Pct 1.3 0.6 - 2.6 %    Last CBC Lab Results  Component Value Date   WBC 6.0 12/16/2023   HGB 12.0 12/16/2023   HCT 39.3 12/16/2023   MCV 76 (L) 12/16/2023   MCH 23.1 (L) 12/16/2023   RDW 15.8 (H) 12/16/2023   PLT 423 12/16/2023   Last metabolic panel Lab Results  Component Value Date   GLUCOSE 105 (H) 11/27/2023   NA 135 11/27/2023   K 3.8 11/27/2023   CL 101 11/27/2023   CO2 25 11/27/2023   BUN 11 11/27/2023   CREATININE 0.73 11/27/2023   GFRNONAA >60 11/27/2023   CALCIUM 9.2 11/27/2023   PROT 7.8 11/27/2023   ALBUMIN 3.9 11/27/2023   LABGLOB 2.8 05/14/2023   BILITOT 0.9 11/27/2023   ALKPHOS 51 11/27/2023   AST 15 11/27/2023   ALT 16 11/27/2023   ANIONGAP 9 11/27/2023   Last lipids Lab Results  Component Value Date   CHOL 228 (H) 09/16/2023   HDL 48 09/16/2023   LDLCALC 163 (H) 09/16/2023   TRIG 97 09/16/2023   CHOLHDL 4.8 (H) 09/16/2023   Last hemoglobin A1c Lab Results  Component  Value Date   HGBA1C 5.7 (H) 05/14/2023   Last thyroid  functions Lab Results  Component Value Date   TSH 0.901 05/14/2023   T4TOTAL 10.1 05/14/2023        Assessment & Plan:  Primary hypertension -     Anemia Profile B  Mixed hyperlipidemia -     Atorvastatin Calcium; Take 1 tablet (  10 mg total) by mouth daily.  Dispense: 90 tablet; Refill: 0  Vitamin D  deficiency  Depression, major, single episode, moderate (HCC)  Obesity, morbid (HCC)  Other orders -     Losartan Potassium-HCTZ; Take 1 tablet by mouth daily.  Dispense: 30 tablet; Refill: 0   Tammy Rush is a 43 yrs old [place] American female sen today for chronic diseases management, no acute distress Anemia: Will check anemia profile to determine the need to continue ferrous sulfate  MDD: Continue Lexapro  10 mg daily no refill needed Vitamin D  deficiency: Continue vitamin D  50,000 units weekly no refill needed hyperlipidemia: Continue Lipitor 10 mg daily refill provided Hypertension: BP not well controlled. Changes d/c Atenolol 50 mg and start Hyzaar 50-12.5 mg daily made in regimen . Goal BP is 130/80. Pt aware to report any persistent high or low readings. DASH diet and exercise encouraged. Exercise at least 150 minutes per week and increase as tolerated. Goal BMI > 25. Stress management encouraged. Avoid nicotine and tobacco product use. Avoid excessive alcohol and NSAID's. Avoid more than 2000 mg of sodium daily. Medications as prescribed. Follow up as scheduled.   - 1-week nurse visit for BP check hey - if no improvement will refer to hypertensive clinic Encourage healthy lifestyle choices, including diet (rich in fruits, vegetables, and lean proteins, and low in salt and simple carbohydrates) and exercise (at least 30 minutes of moderate physical activity daily).     The above assessment and management plan was discussed with the patient. The patient verbalized understanding of and has agreed to the management  plan. Patient is aware to call the clinic if they develop any new symptoms or if symptoms persist or worsen. Patient is aware when to return to the clinic for a follow-up visit. Patient educated on when it is appropriate to go to the emergency department.  Return in about 3 months (around 03/15/2024) for follow-up.    Khylen Riolo [place], DNP [facility] [address] [place], [place] [address] [phone]    Note: This document was prepared by [employer] voice dictation technology and any errors that results from this process are unintentional.

## 2023-12-16 ENCOUNTER — Other Ambulatory Visit

## 2023-12-16 DIAGNOSIS — D509 Iron deficiency anemia, unspecified: Secondary | ICD-10-CM | POA: Diagnosis not present

## 2023-12-16 DIAGNOSIS — I10 Essential (primary) hypertension: Secondary | ICD-10-CM | POA: Diagnosis not present

## 2023-12-17 ENCOUNTER — Other Ambulatory Visit: Payer: Self-pay | Admitting: Nurse Practitioner

## 2023-12-17 DIAGNOSIS — D509 Iron deficiency anemia, unspecified: Secondary | ICD-10-CM

## 2023-12-17 LAB — ANEMIA PROFILE B
Basophils Absolute: 0 10*3/uL (ref 0.0–0.2)
Basos: 0 %
EOS (ABSOLUTE): 0.1 10*3/uL (ref 0.0–0.4)
Eos: 2 %
Ferritin: 34 ng/mL (ref 15–150)
Folate: 6.3 ng/mL (ref >3.0)
Hematocrit: 39.3 % (ref 34.0–46.6)
Hemoglobin: 12 g/dL (ref 11.1–15.9)
Immature Grans (Abs): 0 10*3/uL (ref 0.0–0.1)
Immature Granulocytes: 0 %
Iron Saturation: 11 % — ABNORMAL LOW (ref 15–55)
Iron: 44 ug/dL (ref 27–159)
Lymphocytes Absolute: 1.7 10*3/uL (ref 0.7–3.1)
Lymphs: 28 %
MCH: 23.1 pg — ABNORMAL LOW (ref 26.6–33.0)
MCHC: 30.5 g/dL — ABNORMAL LOW (ref 31.5–35.7)
MCV: 76 fL — ABNORMAL LOW (ref 79–97)
Monocytes Absolute: 0.4 10*3/uL (ref 0.1–0.9)
Monocytes: 6 %
Neutrophils Absolute: 3.8 10*3/uL (ref 1.4–7.0)
Neutrophils: 64 %
Platelets: 423 10*3/uL (ref 150–450)
RBC: 5.19 x10E6/uL (ref 3.77–5.28)
RDW: 15.8 % — ABNORMAL HIGH (ref 11.7–15.4)
Retic Ct Pct: 1.3 % (ref 0.6–2.6)
Total Iron Binding Capacity: 390 ug/dL (ref 250–450)
UIBC: 346 ug/dL (ref 131–425)
Vitamin B-12: 358 pg/mL (ref 232–1245)
WBC: 6 10*3/uL (ref 3.4–10.8)

## 2023-12-17 MED ORDER — ATORVASTATIN CALCIUM 10 MG PO TABS: 10.0000 mg | ORAL_TABLET | Freq: Every day | ORAL | 0 refills | Status: DC

## 2023-12-17 MED ORDER — FERROUS SULFATE 325 (65 FE) MG PO TABS: 325.0000 mg | ORAL_TABLET | Freq: Every day | ORAL | 0 refills | Status: DC

## 2023-12-22 ENCOUNTER — Ambulatory Visit

## 2023-12-24 ENCOUNTER — Other Ambulatory Visit: Payer: Self-pay | Admitting: Nurse Practitioner

## 2023-12-24 DIAGNOSIS — Z1231 Encounter for screening mammogram for malignant neoplasm of breast: Secondary | ICD-10-CM

## 2023-12-29 ENCOUNTER — Other Ambulatory Visit: Payer: Self-pay | Admitting: Nurse Practitioner

## 2023-12-29 ENCOUNTER — Ambulatory Visit

## 2023-12-29 ENCOUNTER — Ambulatory Visit
Admission: RE | Admit: 2023-12-29 | Discharge: 2023-12-29 | Disposition: A | Discharge status: Home / Self Care | Source: Ambulatory Visit | Attending: Nurse Practitioner | Admitting: Nurse Practitioner

## 2023-12-29 VITALS — BP 141/81 | HR 81

## 2023-12-29 DIAGNOSIS — Z1231 Encounter for screening mammogram for malignant neoplasm of breast: Secondary | ICD-10-CM

## 2023-12-29 DIAGNOSIS — I10 Essential (primary) hypertension: Secondary | ICD-10-CM

## 2023-12-29 MED ORDER — LOSARTAN POTASSIUM-HCTZ 100-25 MG PO TABS: 1.0000 | ORAL_TABLET | Freq: Every day | ORAL | 0 refills | Status: DC

## 2023-12-29 NOTE — Progress Notes (Signed)
 Patient came in for BP check - today in office 1st reading was 145/89 HR 86 and 2nd reading was 141/81 HR 84.  Patient states that she reviewed past medication history and states that she was put on Losartan  in the past and it caused her heart rate and BP to increase. Patient states that she started the medication on the first of the month and that after starting BP was elevated. Patient states that she could feel her HR increase and felt "off". Patient stopped taking the medication on the 7th and BP has came down. The following is the patient's home BP log   May 2    AM  144/95  HR 91  PM 152/87 HR 81 May 3     AM  148/79  HR 85  PM 149/79 HR 87 May 4     AM  140/79  HR 81  PM 143/78 HR 78 May 5    AM  139/83  HR 70  PM 138/79 HR 72 May 6    AM  148/84  HR 73  May 7     AM  139/80  HR 72  PM 137/79 HR 71 May 8    AM  133/82  HR 74 May 9    AM  139/82  HR 73  PM 139/78 HR 77 May 10   AM  136/88  HR 78  PM 136/79 HR 72 May 11   AM  134/73  HR 68  PM 134/74 HR 65  May 12   AM 132/73   HR 74   Please review and advise

## 2023-12-29 NOTE — Progress Notes (Signed)
 Had Nurse Visit  BP reading from home not at goal increase Hyzaar to 100- 25 mg and continue Amlodipine  5 mg daily, refer her to Hypertensive clinic

## 2023-12-30 ENCOUNTER — Telehealth: Payer: Self-pay

## 2023-12-30 NOTE — Telephone Encounter (Signed)
 Pt aware of medication change and referral to hypertensive clinic. Pt verbalized understanding

## 2024-01-04 ENCOUNTER — Other Ambulatory Visit: Payer: Self-pay | Admitting: Nurse Practitioner

## 2024-01-04 DIAGNOSIS — I10 Essential (primary) hypertension: Secondary | ICD-10-CM

## 2024-01-12 ENCOUNTER — Other Ambulatory Visit: Payer: Self-pay | Admitting: Nurse Practitioner

## 2024-01-30 ENCOUNTER — Other Ambulatory Visit: Payer: Self-pay | Admitting: Nurse Practitioner

## 2024-01-30 DIAGNOSIS — F321 Major depressive disorder, single episode, moderate: Secondary | ICD-10-CM

## 2024-02-08 ENCOUNTER — Emergency Department (HOSPITAL_BASED_OUTPATIENT_CLINIC_OR_DEPARTMENT_OTHER): Admitting: Radiology

## 2024-02-08 ENCOUNTER — Emergency Department (HOSPITAL_BASED_OUTPATIENT_CLINIC_OR_DEPARTMENT_OTHER)
Admission: EM | Admit: 2024-02-08 | Discharge: 2024-02-08 | Disposition: A | Discharge status: Home / Self Care | Attending: Emergency Medicine | Admitting: Emergency Medicine

## 2024-02-08 ENCOUNTER — Encounter (HOSPITAL_BASED_OUTPATIENT_CLINIC_OR_DEPARTMENT_OTHER): Payer: Self-pay | Admitting: Emergency Medicine

## 2024-02-08 ENCOUNTER — Other Ambulatory Visit: Payer: Self-pay

## 2024-02-08 DIAGNOSIS — Z79899 Other long term (current) drug therapy: Secondary | ICD-10-CM | POA: Insufficient documentation

## 2024-02-08 DIAGNOSIS — I10 Essential (primary) hypertension: Secondary | ICD-10-CM | POA: Diagnosis not present

## 2024-02-08 DIAGNOSIS — W228XXA Striking against or struck by other objects, initial encounter: Secondary | ICD-10-CM | POA: Diagnosis not present

## 2024-02-08 DIAGNOSIS — M7731 Calcaneal spur, right foot: Secondary | ICD-10-CM | POA: Diagnosis not present

## 2024-02-08 DIAGNOSIS — Z87891 Personal history of nicotine dependence: Secondary | ICD-10-CM | POA: Insufficient documentation

## 2024-02-08 DIAGNOSIS — S99921A Unspecified injury of right foot, initial encounter: Secondary | ICD-10-CM | POA: Diagnosis not present

## 2024-02-08 DIAGNOSIS — Y9301 Activity, walking, marching and hiking: Secondary | ICD-10-CM | POA: Insufficient documentation

## 2024-02-08 DIAGNOSIS — M79674 Pain in right toe(s): Secondary | ICD-10-CM | POA: Diagnosis present

## 2024-02-08 DIAGNOSIS — S92524A Nondisplaced fracture of medial phalanx of right lesser toe(s), initial encounter for closed fracture: Secondary | ICD-10-CM | POA: Diagnosis not present

## 2024-02-08 DIAGNOSIS — S92514A Nondisplaced fracture of proximal phalanx of right lesser toe(s), initial encounter for closed fracture: Secondary | ICD-10-CM | POA: Diagnosis not present

## 2024-02-08 NOTE — Discharge Instructions (Addendum)
 As discussed, you did break a small aspect on your toe.  Recommend icing the toe taking ibuprofen , Tylenol for pain.  Also recommend buddy taping toes together like we did in the emergency department and wearing the postop shoe as needed.  Recommend follow-up with primary care or orthopedic specialist for reassessment.  Please do not hesitate to return to emergency department if the worrisome signs and symptoms we discussed become apparent.

## 2024-02-08 NOTE — ED Triage Notes (Signed)
 Pt hit her right toe (4th) on bookcase yesterday, worried about fracture

## 2024-02-08 NOTE — ED Provider Notes (Signed)
 [place] EMERGENCY DEPARTMENT AT [facility] Provider Note   CSN: [id number] Arrival date & time: 02/08/24  1233     Patient presents with: No chief complaint on file.   Tammy Rush is a 43 y.o. female.   HPI   43 year old female presents emergency department with complaints of right toe pain.  States that she was walking yesterday and accidentally kicked a Nurse, mental health.  Reports pain in her right fourth toe.  Concerned that it may be broken.  Woke up today and it was bruised.  Denies any pain/trauma elsewhere.  Past medical history significant for hypertension, thyroid  disease  Prior to Admission medications   Medication Sig Start Date End Date Taking? Authorizing Provider  amLODipine  (NORVASC ) 5 MG tablet TAKE 1 TABLET (5 MG TOTAL) BY MOUTH DAILY. 01/05/24   [facility] Pool, NP  atorvastatin  (LIPITOR) 10 MG tablet Take 1 tablet (10 mg total) by mouth daily. 12/17/23 12/16/24  [facility] Pool, NP  D-5000 125 MCG (5000 UT) TABS Take 1 tablet by mouth daily. 11/03/23   [provider]  diphenhydrAMINE  (BENADRYL ) 25 MG tablet Take 1 tablet (25 mg total) by mouth every 6 (six) hours. 05/27/23   Theadore Ozell HERO, MD  escitalopram  (LEXAPRO ) 10 MG tablet TAKE 1 TABLET BY MOUTH EVERY DAY 01/30/24   [facility] Sebastian Pool, NP  ferrous sulfate  325 (65 FE) MG tablet Take 1 tablet (325 mg total) by mouth daily with breakfast. 12/17/23   Deitra Morton Sebastian, Pool, NP  losartan -hydrochlorothiazide (HYZAAR) 100-25 MG tablet Take 1 tablet by mouth daily. 12/29/23 03/28/24  [facility] Sebastian Pool, NP  norethindrone (MICRONOR) 0.35 MG tablet Take 1 tablet by mouth daily.    [provider]    Allergies: Patient has no known allergies.    Review of Systems  All other systems reviewed and are negative.   Updated Vital Signs BP (!) 161/87 (BP Location: Right Arm)   Pulse 72   Temp 98.5 F (36.9 C)   Resp 20   SpO2 98%   Physical Exam Vitals  and nursing note reviewed.  Constitutional:      General: She is not in acute distress.    Appearance: She is well-developed.  HENT:     Head: Normocephalic and atraumatic.   Eyes:     Conjunctiva/sclera: Conjunctivae normal.    Cardiovascular:     Rate and Rhythm: Normal rate and regular rhythm.     Heart sounds: No murmur heard. Pulmonary:     Effort: Pulmonary effort is normal. No respiratory distress.     Breath sounds: Normal breath sounds.  Abdominal:     Palpations: Abdomen is soft.     Tenderness: There is no abdominal tenderness.   Musculoskeletal:        General: No swelling.     Cervical back: Neck supple.     Comments: Swollen, ecchymotic fourth digit of right foot dorsally.  Tender to palpation proximal and middle phalanx.  No obvious subungual hematoma.  Capillary fill less than 2 seconds.  No other tenderness of right foot/ankle.  Full range of motion right ankle/digits.   Skin:    General: Skin is warm and dry.     Capillary Refill: Capillary refill takes less than 2 seconds.   Neurological:     Mental Status: She is alert.   Psychiatric:        Mood and Affect: Mood normal.     (all labs ordered are listed,  but only abnormal results are displayed) Labs Reviewed - No data to display  EKG: None  Radiology: DG Foot Complete Right Result Date: 02/08/2024 CLINICAL DATA:  Right foot injury. EXAM: RIGHT FOOT COMPLETE - 3+ VIEW COMPARISON:  None Available. FINDINGS: Findings suggest possible subtle fracture along the medial base of the fourth middle phalanx. This is only seen on the AP view. No additional fractures visualized. Small inferior calcaneal spur. Soft tissues are unremarkable. IMPRESSION: Possible subtle fracture along the medial base of the fourth middle phalanx. Recommend correlation with point tenderness. Electronically Signed   By: Toribio Agreste M.D.   On: 02/08/2024 13:17     Procedures   Medications Ordered in the ED - No data to  display                                  Medical Decision Making Amount and/or Complexity of Data Reviewed Radiology: ordered.   This patient presents to the ED for concern of toe pain,, this involves an extensive number of treatment options, and is a complaint that carries with it a high risk of complications and morbidity.  The differential diagnosis includes fracture, strain/pain, dislocation, ligament/tendon injury, ischemic digit, other   Co morbidities that complicate the patient evaluation  See HPI   Additional history obtained:  Additional history obtained from EMR External records from outside source obtained and reviewed including hospital records   Lab Tests:  N/a   Imaging Studies ordered:  I ordered imaging studies including right foot x-ray I independently visualized and interpreted imaging which showed fracture base of middle phalanx right fourth toe. I agree with the radiologist interpretation   Cardiac Monitoring: / EKG:  N/a   Consultations Obtained:  N/a   Problem List / ED Course / Critical interventions / Medication management  Toe fracture Reevaluation of the patient showed that the patient stayed the same I have reviewed the patients home medicines and have made adjustments as needed   Social Determinants of Health:  Former cigarette use.  Denies illicit drug use.   Test / Admission - Considered:  Toe fracture Vitals signs significant for hypertension blood pressure 161/87. Otherwise within normal range and stable throughout visit. Imaging studies significant for: See above 43 year old female presents emergency department with complaints of right toe pain.  States that she was walking yesterday and accidentally kicked a Nurse, mental health.  Reports pain in her right fourth toe.  Concerned that it may be broken.  Woke up today and it was bruised.  Denies any pain/trauma elsewhere. On exam, tenderness along right fourth digit with ecchymosis  present.  No pulse deficits to suggest ischemic digit.  No obvious laceration or abrasion present.  X-ray obtained which did show evidence of fracture.  Patient treated with buddy tape as well as postop shoe.  Recommend symptomatic therapy and follow-up with Ortho/PCP in the outpatient setting.  Treatment plan discussed with patient and she acknowledged understanding was agreeable to said plan.  Patient overall well-appearing, afebrile in no acute distress. Worrisome signs and symptoms were discussed with the patient, and the patient acknowledged understanding to return to the ED if noticed. Patient was stable upon discharge.       Final diagnoses:  Closed nondisplaced fracture of middle phalanx of lesser toe of right foot, initial encounter    ED Discharge Orders     None          Silver,  Wonda LABOR, [place] 02/08/24 1338    Geraldene Hamilton, MD 02/09/24 2034

## 2024-02-18 ENCOUNTER — Ambulatory Visit (INDEPENDENT_AMBULATORY_CARE_PROVIDER_SITE_OTHER): Admitting: Family

## 2024-02-18 ENCOUNTER — Encounter: Payer: Self-pay | Admitting: Family

## 2024-02-18 DIAGNOSIS — S92504A Nondisplaced unspecified fracture of right lesser toe(s), initial encounter for closed fracture: Secondary | ICD-10-CM | POA: Diagnosis not present

## 2024-02-18 NOTE — Progress Notes (Signed)
 Office Visit Note   Patient: Tammy Rush           Date of Birth: [birth year]           MRN: [id number] Visit Date: 02/18/2024              Requested by: Deitra Morton Sebastian Nena, NP [address] [place],  [place] [address] PCP: Deitra Morton Sebastian Nena, NP  Chief Complaint  Patient presents with   Right Foot - Fracture    4th toe fracture      HPI: The patient is a 43 year old woman who presents for evaluation of fourth toe pain right foot she reports she stubbed her toe on a bookcase on June 21 in the emergency department on the 22nd she did have radiographs which confirmed fracture.  She has been buddy taping and wearing a postop shoe.  She has already returned to work wearing a postop shoe  Complains of minimal pain  Assessment & Plan: Visit Diagnoses: No diagnosis found.  Plan: She will continue the postop shoe for the next 3 weeks.  May put buddy tape for 1 more week.  Discussed typical course she will follow-up as needed  Follow-Up Instructions: No follow-ups on file.   Ortho Exam  Patient is alert, oriented, no adenopathy, well-dressed, normal affect, normal respiratory effort. On examination right foot the fourth toe is straight is without edema or erythema no ecchymosis distally neurovascularly intact.    Imaging: No results found. No images are attached to the encounter.  Labs: Lab Results  Component Value Date   HGBA1C 5.7 (H) 05/14/2023   REPTSTATUS 10/31/2014 FINAL 10/29/2014   CULT  10/29/2014    Multiple bacterial morphotypes present, none predominant. Suggest appropriate recollection if clinically indicated. Performed at [facility] Results  Component Value Date   ALBUMIN 3.9 11/27/2023   ALBUMIN 4.0 05/14/2023   ALBUMIN 3.9 01/16/2020    No results found for: MG Lab Results  Component Value Date   VD25OH 47.1 05/14/2023    No results found for: PREALBUMIN    Latest Ref Rng & Units 12/16/2023    9:06  AM 11/27/2023    4:37 PM 05/14/2023   11:00 AM  CBC EXTENDED  WBC 3.4 - 10.8 x10E3/uL 6.0  10.3  6.4   RBC 3.77 - 5.28 x10E6/uL 5.19  5.66  5.32   Hemoglobin 11.1 - 15.9 g/dL 87.9  86.4  87.5   HCT 34.0 - 46.6 % 39.3  42.4  40.6   Platelets 150 - 450 x10E3/uL 423  444  457   NEUT# 1.4 - 7.0 x10E3/uL 3.8  9.0  3.8   Lymph# 0.7 - 3.1 x10E3/uL 1.7  0.6  1.8      There is no height or weight on file to calculate BMI.  Orders:  No orders of the defined types were placed in this encounter.  No orders of the defined types were placed in this encounter.    Procedures: No procedures performed  Clinical Data: No additional findings.  ROS:  All other systems negative, except as noted in the HPI. Review of Systems  Objective: Vital Signs: There were no vitals taken for this visit.  Specialty Comments:  No specialty comments available.  PMFS History: Patient Active Problem List   Diagnosis Date Noted   Hospital discharge follow-up 12/01/2023   Dehydration 12/01/2023   Syncope 12/01/2023   Gastroenteritis 12/01/2023   Depression, major, single  episode, moderate (HCC) 09/16/2023   Obesity, morbid (HCC) 09/16/2023   Hyperlipidemia 06/19/2023   Primary hypertension 05/14/2023   Routine medical exam 05/14/2023   Vitamin D  deficiency 05/14/2023   Family history of diabetes mellitus in mother 05/14/2023   Acute diffuse otitis externa of right ear 05/14/2023   Past Medical History:  Diagnosis Date   Hypertension    Miscarriage    Thyroid  disease     Family History  Problem Relation Age of Onset   Hypertension Mother    Diabetes Mother    Hypertension Father    Breast cancer Neg Hx     Past Surgical History:  Procedure Laterality Date   DENTAL SURGERY  09/01/2023   Social History   Occupational History   Not on file  Tobacco Use   Smoking status: Former    Current packs/day: 0.00    Types: Cigarettes    Quit date: 09/03/2017    Years since quitting: 6.4    Smokeless tobacco: Never  Vaping Use   Vaping status: Some Days  Substance and Sexual Activity   Alcohol use: No   Drug use: No   Sexual activity: Yes    Birth control/protection: Pill

## 2024-03-14 ENCOUNTER — Other Ambulatory Visit: Payer: Self-pay | Admitting: Nurse Practitioner

## 2024-03-14 DIAGNOSIS — E782 Mixed hyperlipidemia: Secondary | ICD-10-CM

## 2024-03-22 ENCOUNTER — Ambulatory Visit: Admitting: Nurse Practitioner

## 2024-03-22 ENCOUNTER — Encounter: Payer: Self-pay | Admitting: Nurse Practitioner

## 2024-03-22 VITALS — BP 129/81 | HR 80 | Temp 97.2°F | Ht 66.0 in | Wt 229.4 lb

## 2024-03-22 DIAGNOSIS — E559 Vitamin D deficiency, unspecified: Secondary | ICD-10-CM | POA: Diagnosis not present

## 2024-03-22 DIAGNOSIS — I10 Essential (primary) hypertension: Secondary | ICD-10-CM | POA: Diagnosis not present

## 2024-03-22 DIAGNOSIS — E782 Mixed hyperlipidemia: Secondary | ICD-10-CM | POA: Diagnosis not present

## 2024-03-22 DIAGNOSIS — D509 Iron deficiency anemia, unspecified: Secondary | ICD-10-CM | POA: Insufficient documentation

## 2024-03-22 DIAGNOSIS — F321 Major depressive disorder, single episode, moderate: Secondary | ICD-10-CM | POA: Diagnosis not present

## 2024-03-22 MED ORDER — LISINOPRIL-HYDROCHLOROTHIAZIDE 10-12.5 MG PO TABS: 1.0000 | ORAL_TABLET | Freq: Every day | ORAL | 0 refills | Status: DC

## 2024-03-22 NOTE — Progress Notes (Signed)
 Established Patient Office Visit  Subjective  Patient ID: Tammy Rush, female    DOB: 05/09/1981  Age: 43 y.o. MRN: [id number]  Chief Complaint  Patient presents with   Medical Management of Chronic Issues    3 month    HPI Tammy Rush 43 year old female present March 22, 2024 for 3 months follow-up for chronic disease management Lipid/Cholesterol, Follow-up  Last lipid panel Other pertinent labs  Lab Results  Component Value Date   CHOL 228 (H) 09/16/2023   HDL 48 09/16/2023   LDLCALC 163 (H) 09/16/2023   TRIG 97 09/16/2023   CHOLHDL 4.8 (H) 09/16/2023   Lab Results  Component Value Date   ALT 16 11/27/2023   AST 15 11/27/2023   PLT 423 12/16/2023   TSH 0.901 05/14/2023     She was last seen for this 3 months ago.  Management since that visit includes Lipitor 10 mg daily.  She reports excellent compliance with treatment. She is not having side effects.   Symptoms: No chest pain No chest pressure/discomfort  No dyspnea No lower extremity edema  No numbness or tingling of extremity No orthopnea  No palpitations No paroxysmal nocturnal dyspnea  No speech difficulty No syncope   Current diet: on average, 2 meals per day Current exercise: none  The 10-year ASCVD risk score (Arnett DK, et al., 2019) is: 3.3%  The patient is a -year-old  with a history of anemia, currently managed with daily iron supplements. Reports feeling less fatigued and has noticed an improvement in her energy levels. Recent lab results show a rise in hemoglobin levels, indicating positive response to treatment. Continues to follow her prescribed regimen and attends regular follow-up appointments.    Hypertension, follow-up  BP Readings from Last 3 Encounters:  03/22/24 129/81  02/08/24 (!) 161/87  12/29/23 (!) 141/81   Wt Readings from Last 3 Encounters:  03/22/24 229 lb 6.4 oz (104.1 kg)  12/15/23 218 lb 3.2 oz (99 kg)  12/01/23 221 lb (100.2 kg)     She was last seen for  hypertension 3 months ago.  BP at that visit was 161/87. Management since that visit includes amlodipine  5 mg & Hyazaar which she reports that she is not doing well and had stopped taking it  . She reports fair compliance with treatment. She is having side effects. Heart palpitations makes fee weird She is following a Low Sodium diet. She is not exercising. She does smoke. vape Use of agents associated with hypertension: none.   Outside blood pressures are 120/78  my blood pressure is always high by come to the doctor's office May consider the possibility of whitecoat syndrome Symptoms: No chest pain No chest pressure  No palpitations No syncope  No dyspnea No orthopnea  No paroxysmal nocturnal dyspnea No lower extremity edema  Sh ehas an upcoming appontment at  Pertinent labs Lab Results  Component Value Date   CHOL 228 (H) 09/16/2023   HDL 48 09/16/2023   LDLCALC 163 (H) 09/16/2023   TRIG 97 09/16/2023   CHOLHDL 4.8 (H) 09/16/2023   Lab Results  Component Value Date   NA 135 11/27/2023   K 3.8 11/27/2023   CREATININE 0.73 11/27/2023   GFRNONAA >60 11/27/2023   GLUCOSE 105 (H) 11/27/2023   TSH 0.901 05/14/2023     The 10-year ASCVD risk score (Arnett DK, et al., 2019) is: 3.3%  Vitamin D  deficiency, follow-up  Lab Results  Component Value Date   VD25OH 47.1 05/14/2023  CALCIUM  9.2 11/27/2023   CALCIUM  9.3 05/14/2023        Wt Readings from Last 3 Encounters:  03/22/24 229 lb 6.4 oz (104.1 kg)  12/15/23 218 lb 3.2 oz (99 kg)  12/01/23 221 lb (100.2 kg)    She was last seen for vitamin D  deficiency 3 months ago.  Management since that visit includes vitamin D   50,000 unit weekly. She reports  compliance with treatment. She is not having side effects.   Symptoms: No change in energy level No numbness or tingling  No bone pain No unexplained fracture   Depression, Follow-up  She  was last seen for this 3 months ago. Changes made at last visit include  Lexapro  10 mg daily.   She reports excellent compliance with treatment. She is not having side effects.   She reports excellent tolerance of treatment. Current symptoms include: weight gain She feels she is Improved since last visit.     12/01/2023   11:33 AM 09/16/2023    8:13 AM 06/19/2023    2:42 PM  Depression screen PHQ 2/9  Decreased Interest 0 3 0  Down, Depressed, Hopeless 0 3 0  PHQ - 2 Score 0 6 0  Altered sleeping 0 3 0  Tired, decreased energy 0 1 0  Change in appetite 0 0 0  Feeling bad or failure about yourself  0 0 0  Trouble concentrating 0 0 0  Moving slowly or fidgety/restless 0 0 0  Suicidal thoughts 0 0 0  PHQ-9 Score 0 10 0  Difficult doing work/chores Not difficult at all Somewhat difficult Not difficult at all    Patient Active Problem List   Diagnosis Date Noted   Iron deficiency anemia 03/22/2024   Hospital discharge follow-up 12/01/2023   Dehydration 12/01/2023   Syncope 12/01/2023   Gastroenteritis 12/01/2023   Depression, major, single episode, moderate (HCC) 09/16/2023   Obesity, morbid (HCC) 09/16/2023   Hyperlipidemia 06/19/2023   Primary hypertension 05/14/2023   Routine medical exam 05/14/2023   Vitamin D  deficiency 05/14/2023   Family history of diabetes mellitus in mother 05/14/2023   Acute diffuse otitis externa of right ear 05/14/2023   Past Medical History:  Diagnosis Date   Hypertension    Miscarriage    Thyroid  disease    Past Surgical History:  Procedure Laterality Date   DENTAL SURGERY  09/01/2023   Social History   Tobacco Use   Smoking status: Former    Current packs/day: 0.00    Types: Cigarettes    Quit date: 09/03/2017    Years since quitting: 6.5   Smokeless tobacco: Never  Vaping Use   Vaping status: Some Days  Substance Use Topics   Alcohol use: No   Drug use: No   Social History   Socioeconomic History   Marital status: Single    Spouse name: Not on file   Number of children: 1   Years of  education: Not on file   Highest education level: 12th grade  Occupational History   Not on file  Tobacco Use   Smoking status: Former    Current packs/day: 0.00    Types: Cigarettes    Quit date: 09/03/2017    Years since quitting: 6.5   Smokeless tobacco: Never  Vaping Use   Vaping status: Some Days  Substance and Sexual Activity   Alcohol use: No   Drug use: No   Sexual activity: Yes    Birth control/protection: Pill  Other Topics Concern  Not on file  Social History Narrative   Not on file   Social Drivers of Health   Financial Resource Strain: Medium Risk (06/12/2023)   Overall Financial Resource Strain (CARDIA)    Difficulty of Paying Living Expenses: Somewhat hard  Food Insecurity: Food Insecurity Present (06/12/2023)   Hunger Vital Sign    Worried About Running Out of Food in the Last Year: Never true    Ran Out of Food in the Last Year: Sometimes true  Transportation Needs: No Transportation Needs (06/12/2023)   PRAPARE - Administrator, Civil Service (Medical): No    Lack of Transportation (Non-Medical): No  Physical Activity: Insufficiently Active (06/12/2023)   Exercise Vital Sign    Days of Exercise per Week: 3 days    Minutes of Exercise per Session: 20 min  Stress: No Stress Concern Present (06/12/2023)   [employer] of Occupational Health - Occupational Stress Questionnaire    Feeling of Stress : Not at all  Social Connections: Moderately Isolated (06/12/2023)   Social Connection and Isolation Panel    Frequency of Communication with Friends and Family: More than three times a week    Frequency of Social Gatherings with Friends and Family: More than three times a week    Attends Religious Services: 1 to 4 times per year    Active Member of [employer] or Organizations: No    Attends Engineer, structural: Not on file    Marital Status: Never married  Catering manager Violence: Not on file   Family Status  Relation Name Status    Mother  Alive   Father  Alive   Neg Hx  (Not Specified)  No partnership data on file   Family History  Problem Relation Age of Onset   Hypertension Mother    Diabetes Mother    Hypertension Father    Breast cancer Neg Hx    No Known Allergies    Review of Systems  Constitutional:  Negative for chills and fever.  HENT:  Negative for congestion and sore throat.   Respiratory:  Negative for cough and shortness of breath.   Cardiovascular:  Negative for chest pain and leg swelling.  Gastrointestinal:  Negative for blood in stool, diarrhea, nausea and vomiting.  Musculoskeletal:  Negative for falls.  Skin:  Negative for itching and rash.  Neurological:  Negative for dizziness and headaches.  Psychiatric/Behavioral:  Negative for depression and suicidal ideas. The patient does not have insomnia.    Negative unless indicated in HPI   Objective:     BP 129/81   Pulse 80   Temp (!) 97.2 F (36.2 C) (Temporal)   Ht 5' 6 (1.676 m)   Wt 229 lb 6.4 oz (104.1 kg)   SpO2 100%   BMI 37.03 kg/m  BP Readings from Last 3 Encounters:  03/22/24 129/81  02/08/24 (!) 161/87  12/29/23 (!) 141/81   Wt Readings from Last 3 Encounters:  03/22/24 229 lb 6.4 oz (104.1 kg)  12/15/23 218 lb 3.2 oz (99 kg)  12/01/23 221 lb (100.2 kg)      Physical Exam Vitals and nursing note reviewed.  Constitutional:      General: She is not in acute distress.    Appearance: She is obese.  HENT:     Head: Normocephalic and atraumatic.     Nose: Nose normal.  Eyes:     General: No scleral icterus.    Extraocular Movements: Extraocular movements intact.  Conjunctiva/sclera: Conjunctivae normal.     Pupils: Pupils are equal, round, and reactive to light.  Cardiovascular:     Heart sounds: Normal heart sounds.  Pulmonary:     Effort: Pulmonary effort is normal.     Breath sounds: Normal breath sounds.  Musculoskeletal:        General: Normal range of motion.     Right lower leg: No edema.      Left lower leg: No edema.  Skin:    General: Skin is warm and dry.     Findings: No rash.  Neurological:     Mental Status: She is alert.  Psychiatric:        Attention and Perception: Attention and perception normal.        Mood and Affect: Mood and affect normal.        Speech: Speech normal.        Behavior: Behavior normal.        Thought Content: Thought content normal. Thought content does not include suicidal ideation. Thought content does not include suicidal plan.        Cognition and Memory: Cognition and memory normal.        Judgment: Judgment normal.      No results found for any visits on 03/22/24.  Last CBC Lab Results  Component Value Date   WBC 6.0 12/16/2023   HGB 12.0 12/16/2023   HCT 39.3 12/16/2023   MCV 76 (L) 12/16/2023   MCH 23.1 (L) 12/16/2023   RDW 15.8 (H) 12/16/2023   PLT 423 12/16/2023   Last metabolic panel Lab Results  Component Value Date   GLUCOSE 105 (H) 11/27/2023   NA 135 11/27/2023   K 3.8 11/27/2023   CL 101 11/27/2023   CO2 25 11/27/2023   BUN 11 11/27/2023   CREATININE 0.73 11/27/2023   GFRNONAA >60 11/27/2023   CALCIUM  9.2 11/27/2023   PROT 7.8 11/27/2023   ALBUMIN 3.9 11/27/2023   LABGLOB 2.8 05/14/2023   BILITOT 0.9 11/27/2023   ALKPHOS 51 11/27/2023   AST 15 11/27/2023   ALT 16 11/27/2023   ANIONGAP 9 11/27/2023   Last lipids Lab Results  Component Value Date   CHOL 228 (H) 09/16/2023   HDL 48 09/16/2023   LDLCALC 163 (H) 09/16/2023   TRIG 97 09/16/2023   CHOLHDL 4.8 (H) 09/16/2023   Last hemoglobin A1c Lab Results  Component Value Date   HGBA1C 5.7 (H) 05/14/2023   Last thyroid  functions Lab Results  Component Value Date   TSH 0.901 05/14/2023   T4TOTAL 10.1 05/14/2023        Assessment & Plan:  Primary hypertension -     Lisinopril -hydroCHLOROthiazide ; Take 1 tablet by mouth daily.  Dispense: 90 tablet; Refill: 0  Vitamin D  deficiency  Mixed hyperlipidemia  Iron deficiency anemia,  unspecified iron deficiency anemia type  Depression, major, single episode, moderate (HCC)    Tammy Rush is 43 year old African-American female seen today for chronic disease management, no acute distress Hypertension: Not well-controlled with current medication she she has not upcoming appointment and symptoms will wait hypertensive clinic.  Since she is not tolerated as of will DC it and start Zestoretic  and continue amlodipine  5 mg daily Hyperlipidemia: Continue Lipitor 10 mg daily no refill needed Vitamin D  deficiency: Continue vitamin D  supplement 50,000 units weekly, no refill needed MDD: Continue Lexapro  10 mg daily no refill needed Iron deficiency anemia: Continue ferrous sulfate  325 1 tablet daily take it with vitamin C  to improve absorption, no refill needed  Continue healthy lifestyle choices, including diet (rich in fruits, vegetables, and lean proteins, and low in salt and simple carbohydrates) and exercise (at least 30 minutes of moderate physical activity daily).     The above assessment and management plan was discussed with the patient. The patient verbalized understanding of and has agreed to the management plan. Patient is aware to call the clinic if they develop any new symptoms or if symptoms persist or worsen. Patient is aware when to return to the clinic for a follow-up visit. Patient educated on when it is appropriate to go to the emergency department.  Return in about 3 months (around 06/22/2024) for chronic Diseases Management.    Tammy Bonine [place] Thompson, DNP [facility] [address] [place], [place] [address] [phone]    Note: This document was prepared by [employer] voice dictation technology and any errors that results from this process are unintentional.

## 2024-04-12 ENCOUNTER — Other Ambulatory Visit: Payer: Self-pay | Admitting: Nurse Practitioner

## 2024-04-12 DIAGNOSIS — I10 Essential (primary) hypertension: Secondary | ICD-10-CM

## 2024-04-26 DIAGNOSIS — J40 Bronchitis, not specified as acute or chronic: Secondary | ICD-10-CM | POA: Diagnosis not present

## 2024-05-10 ENCOUNTER — Encounter (HOSPITAL_BASED_OUTPATIENT_CLINIC_OR_DEPARTMENT_OTHER): Payer: Self-pay

## 2024-05-10 ENCOUNTER — Encounter (HOSPITAL_BASED_OUTPATIENT_CLINIC_OR_DEPARTMENT_OTHER): Payer: Self-pay | Admitting: Cardiovascular Disease

## 2024-05-10 ENCOUNTER — Ambulatory Visit (INDEPENDENT_AMBULATORY_CARE_PROVIDER_SITE_OTHER): Admitting: Cardiovascular Disease

## 2024-05-10 VITALS — BP 138/84 | HR 82 | Resp 17 | Ht 66.0 in | Wt 230.0 lb

## 2024-05-10 DIAGNOSIS — Z6837 Body mass index (BMI) 37.0-37.9, adult: Secondary | ICD-10-CM | POA: Diagnosis not present

## 2024-05-10 DIAGNOSIS — E041 Nontoxic single thyroid nodule: Secondary | ICD-10-CM | POA: Diagnosis not present

## 2024-05-10 DIAGNOSIS — E782 Mixed hyperlipidemia: Secondary | ICD-10-CM

## 2024-05-10 DIAGNOSIS — E049 Nontoxic goiter, unspecified: Secondary | ICD-10-CM

## 2024-05-10 DIAGNOSIS — I10 Essential (primary) hypertension: Secondary | ICD-10-CM | POA: Diagnosis not present

## 2024-05-10 DIAGNOSIS — R0609 Other forms of dyspnea: Secondary | ICD-10-CM | POA: Diagnosis not present

## 2024-05-10 MED ORDER — METOPROLOL TARTRATE 100 MG PO TABS: 100.0000 mg | ORAL_TABLET | Freq: Once | ORAL | 0 refills | Status: DC

## 2024-05-10 NOTE — Progress Notes (Signed)
 Advanced [facility]pertension [facility] Initial Assessment:    Date:  05/10/2024   ID:  Tammy Rush, DOB 14-Dec-1980, MRN [id number]  PCP:  Deitra Morton Sebastian Nena, NP  Cardiologist:  None  Nephrologist:  Referring MD: Deitra Morton Sebastian, Thur[place]*   CC: Hypertension  History of Present Illness:    Tammy Rush is a 43 y.o. female with a hx of hypertension and hyperlipidemia here to establish care in the [facility]. She has been working with Nena Deitra. Morton Sebastian, NP and BP has remained uncontrolled despite taking amlodipine , losartan  and hydrochlorothiazide , so she was referred to ADV HTN clinic.   Discussed the use of AI scribe software for clinical note transcription with the patient, who gave verbal consent to proceed.  History of Present Illness Tammy Rush has had hypertension for approximately 18 years, which began 3 years after pregnancy. Initially, her blood pressure was elevated during a period of family stress and anxiety.  She has been on antihypertensive medication since then, currently taking amlodipine , lisinopril , and hydrochlorothiazide . Her home blood pressure readings are generally between 127-130/72-83 mmHg, but they tend to be higher in medical settings. She has noticed some improvement in her blood pressure readings since the addition of amlodipine .  She experiences shortness of breath, particularly when climbing stairs, which has worsened since having COVID-19. No chest pain, orthopnea, or peripheral edema. She feels rested upon waking and does not snore.  She has a history of thyroid  nodules identified via ultrasound a year ago, and she feels they have increased in size. Her thyroid  hormone levels were normal at the last check.  Her family history includes diabetes and kidney failure in her mother, who also had a stroke. No family history of heart disease.  In terms of lifestyle, she works at [employer], which keeps her active, and she walks  about 11,000 steps a day. Her diet includes eating out and consuming sweets, and she does not add salt to her food at home. She has reduced her soda intake and does not consume alcohol regularly. She does not smoke. She has experienced weight fluctuations, often regaining weight she loses, and attributes this to her diet and recent use of steroids. She does not eat breakfast and typically feels hungry around 2 PM. She has a history of depression following a breakup, which affected her weight. She is diligent about taking her medications and uses Tylenol for pain, avoiding NSAIDs due to their potential impact on blood pressure.   Previous antihypertensives:    Past Medical History:  Diagnosis Date   Hypertension    Miscarriage    Thyroid  disease     Past Surgical History:  Procedure Laterality Date   DENTAL SURGERY  09/01/2023    Current Medications: Current Meds  Medication Sig   albuterol (VENTOLIN HFA) 108 (90 Base) MCG/ACT inhaler Inhale 2 puffs into the lungs.   amLODipine  (NORVASC ) 5 MG tablet TAKE 1 TABLET (5 MG TOTAL) BY MOUTH DAILY.   atorvastatin  (LIPITOR) 10 MG tablet TAKE 1 TABLET BY MOUTH EVERY DAY   D-5000 125 MCG (5000 UT) TABS Take 1 tablet by mouth daily.   diphenhydrAMINE  (BENADRYL ) 25 MG tablet Take 1 tablet (25 mg total) by mouth every 6 (six) hours.   escitalopram  (LEXAPRO ) 10 MG tablet TAKE 1 TABLET BY MOUTH EVERY DAY   ferrous sulfate  325 (65 FE) MG tablet Take 1 tablet (325 mg total) by mouth daily with breakfast.   lisinopril -hydrochlorothiazide  (ZESTORETIC ) 10-12.5 MG tablet  Take 1 tablet by mouth daily.   metoprolol  tartrate (LOPRESSOR ) 100 MG tablet Take 1 tablet (100 mg total) by mouth once for 1 dose. Take 90-120 minutes prior to scan. Hold for SBP less than 110.   norethindrone-ethinyl estradiol (LOESTRIN) 1-20 MG-MCG tablet Take 1 tablet by mouth every morning.     Allergies:   Patient has no known allergies.   Social History   Socioeconomic  History   Marital status: Single    Spouse name: Not on file   Number of children: 1   Years of education: Not on file   Highest education level: 12th grade  Occupational History   Not on file  Tobacco Use   Smoking status: Former    Current packs/day: 0.00    Types: Cigarettes    Quit date: 09/03/2017    Years since quitting: 6.6   Smokeless tobacco: Never  Vaping Use   Vaping status: Some Days  Substance and Sexual Activity   Alcohol use: No   Drug use: No   Sexual activity: Yes    Birth control/protection: Pill  Other Topics Concern   Not on file  Social History Narrative   Not on file   Social Drivers of Health   Financial Resource Strain: Medium Risk (06/12/2023)   Overall Financial Resource Strain (CARDIA)    Difficulty of Paying Living Expenses: Somewhat hard  Food Insecurity: No Food Insecurity (04/26/2024)   [facility]   Hunger Vital Sign    Within the past 12 months, you worried that your food would run out before you got the money to buy more.: Never true    Within the past 12 months, the food you bought just didn't last and you didn't have money to get more.: Never true  Transportation Needs: No Transportation Needs (04/26/2024)   [facility] from [facility]   PRAPARE - Transportation    Lack of Transportation (Medical): No    Lack of Transportation (Non-Medical): No  Physical Activity: Insufficiently Active (06/12/2023)   Exercise Vital Sign    Days of Exercise per Week: 3 days    Minutes of Exercise per Session: 20 min  Stress: No Stress Concern Present (06/12/2023)   [employer] of Occupational Health - Occupational Stress Questionnaire    Feeling of Stress : Not at all  Social Connections: Moderately Isolated (06/12/2023)   Social Connection and Isolation Panel    Frequency of Communication with Friends and Family: More than three times a week    Frequency of Social Gatherings with Friends and Family: More than three times a  week    Attends Religious Services: 1 to 4 times per year    Active Member of [employer] or Organizations: No    Attends Engineer, structural: Not on file    Marital Status: Never married     Family History: The patient's family history includes CVA in her mother; Diabetes in her mother; Hypertension in her father and mother; Kidney disease in her mother. There is no history of Breast cancer.  ROS:   Please see the history of present illness.     All other systems reviewed and are negative.  EKGs/Labs/Other Studies Reviewed:    EKG:  EKG is not ordered today.   EKG Interpretation Date/Time:    Ventricular Rate:    PR Interval:    QRS Duration:    QT Interval:    QTC Calculation:   R Axis:  Text Interpretation:           Recent Labs: 05/14/2023: TSH 0.901 11/27/2023: ALT 16; BUN 11; Creatinine, Ser 0.73; Potassium 3.8; Sodium 135 12/16/2023: Hemoglobin 12.0; Platelets 423   Recent Lipid Panel    Component Value Date/Time   CHOL 228 (H) 09/16/2023 0842   TRIG 97 09/16/2023 0842   HDL 48 09/16/2023 0842   CHOLHDL 4.8 (H) 09/16/2023 0842   LDLCALC 163 (H) 09/16/2023 0842    Physical Exam:   VS:  BP 138/84 (BP Location: Right Arm)   Pulse 82   Resp 17   Ht 5' 6 (1.676 m)   Wt 230 lb (104.3 kg)   SpO2 100%   BMI 37.12 kg/m  , BMI Body mass index is 37.12 kg/m. GENERAL:  Well appearing HEENT: Pupils equal round and reactive, fundi not visualized, oral mucosa unremarkable NECK:  No jugular venous distention, waveform within normal limits, carotid upstroke brisk and symmetric, no bruits, no thyromegaly LYMPHATICS:  No cervical adenopathy LUNGS:  Clear to auscultation bilaterally HEART:  RRR.  PMI not displaced or sustained,S1 and S2 within normal limits, no S3, no S4, no clicks, no rubs, no murmurs ABD:  Flat, positive bowel sounds normal in frequency in pitch, no bruits, no rebound, no guarding, no midline pulsatile mass, no hepatomegaly, no  splenomegaly EXT:  2 plus pulses throughout, no edema, no cyanosis no clubbing SKIN:  No rashes no nodules NEURO:  Cranial nerves II through XII grossly intact, motor grossly intact throughout PSYCH:  Cognitively intact, oriented to person place and time   ASSESSMENT/PLAN:    Assessment & Plan # Essential hypertension, difficult to control Hypertension for 18 years, potential white coat syndrome superimposed on primary hypertension. Home readings within target. Recent addition of amlodipine . Discussed lisinopril  side effects. Emphasized home monitoring to prevent overtreatment. - Continue amlodipine , lisinopril , and hydrochlorothiazide . - Monitor blood pressure at home twice daily for one month. - Bring blood pressure machine and readings to follow-up. - Check renin, aldosterone, and cortisol levels for secondary hypertension.  # Shortness of breath on exertion Exertional dyspnea post-COVID-19. - Order cardiac CT with contrast for coronary artery disease. - Evaluate thyroid  function and structure.  # Thyroid  nodule with thyroid  enlargement Thyroid  nodules with perceived size increase. Normal thyroid  function tests. Potential compression symptoms contributing to dyspnea. - Order thyroid  ultrasound for nodule size and compression. - Check thyroid  hormone levels.  # Obesity Obesity with dietary and exercise challenges. Discussed diet impact on weight and blood pressure. Encouraged meal planning. - Refer to Healthy Weight and Wellness program. - Encourage meal planning and preparation.     Screening for Secondary Hypertension:     05/10/2024    2:11 PM  Causes  Drugs/Herbals Screened     - Comments no added salt. + soda.  No EtOH.  No tobacco  Renovascular HTN Screened     - Comments check renal Dopplers  Sleep Apnea Screened     - Comments no symptoms  Thyroid  Disease Screened  Hyperaldosteronism Screened  Pheochromocytoma N/A  Cushing's Syndrome Screened   Hyperparathyroidism Screened  Coarctation of the Aorta Screened  Compliance Screened    Relevant Labs/Studies:    Latest Ref Rng & Units 11/27/2023    4:37 PM 05/14/2023   11:00 AM 01/16/2020    3:56 PM  Basic Labs  Sodium 135 - 145 mmol/L 135  139  139   Potassium 3.5 - 5.1 mmol/L 3.8  4.4  3.2   Creatinine 0.44 -  1.00 mg/dL 9.26  9.31  9.32        Latest Ref Rng & Units 05/14/2023   11:00 AM  Thyroid    TSH 0.450 - 4.500 uIU/mL 0.901    Disposition:    FU with MD/PharmD in 2-3 months   Medication Adjustments/Labs and Tests Ordered: Current medicines are reviewed at length with the patient today.  Concerns regarding medicines are outlined above.  Orders Placed This Encounter  Procedures   CT CORONARY MORPH W/CTA COR W/SCORE W/CA W/CM &/OR WO/CM   US  THYROID    Aldosterone + renin activity w/ ratio   Cortisol   TSH   Amb Ref to Medical Weight Management   Meds ordered this encounter  Medications   metoprolol  tartrate (LOPRESSOR ) 100 MG tablet    Sig: Take 1 tablet (100 mg total) by mouth once for 1 dose. Take 90-120 minutes prior to scan. Hold for SBP less than 110.    Dispense:  1 tablet    Refill:  0     Signed, Annabella Scarce, MD  05/10/2024 5:53 PM    [facility]

## 2024-05-10 NOTE — Patient Instructions (Signed)
 Medication Instructions:  Your physician recommends that you continue on your current medications as directed. Please refer to the Current Medication list given to you today.   Labwork: RENIN/ALDOSTERONE/CORTISOL/TSH SOON, NEEDS TO BE DONE FIRST THING IN THE MORNING   Testing/Procedures: Your physician has requested that you have cardiac CT. Cardiac computed tomography (CT) is a painless test that uses an x-ray machine to take clear, detailed pictures of your heart. For further information please visit [email or web address] Please follow instruction sheet as given.  THYROID  ULTRASOUND   Follow-Up: 8 TO 10 WEEKS WITH CAITLIN W NP   Any Other Special Instructions Will Be Listed Below (If Applicable).  MONITOR YOUR BLOOD PRESSURE TWICE A DAY AND LOG. BRING LOG AND MACHINE TO FOLLOW UP   Your cardiac CT will be scheduled at one of the below locations:   [facility] [address] [place], [place] [address] [phone] (Severe contrast allergies only)  OR   [facility] [address] [place], [place] [address] [phone]  OR   [facility] [address] [place], [place] [address] [phone]  OR   [facility] [address]  [place], [place] [address] [phone]  OR   [facility] [address] [place], [place] [id number]  If scheduled at [facility], please arrive at the [facility] and Children's Entrance (Entrance C2) of [facility] 30 minutes prior to test start time. You can use the FREE valet parking offered at entrance C (encouraged to control the heart rate for the test)  Proceed to the [facility] Radiology Department (first floor) to check-in and test prep.  All radiology patients and guests should use entrance C2 at [facility], accessed from [facility], even though the hospital's physical address listed is [address].  If scheduled at the Heart and Vascular Tower at [address], please enter the parking lot using the [address] entrance and use the FREE valet service at the patient drop-off area. Enter the building and check-in with registration on the main floor.  If scheduled at [facility], please arrive to the Heart and Vascular Center 15 mins early for check-in and test prep.  There is spacious parking and easy access to the radiology department from the [facility] Heart and Vascular entrance. Please enter here and check-in with the desk attendant.   If scheduled at [facility], please arrive 30 minutes early for check-in and test prep.  Please follow these instructions carefully (unless otherwise directed):  An IV will be required for this test and Nitroglycerin will be given.  Hold all erectile dysfunction medications at least 3 days (72 hrs) prior to test. (Ie viagra, cialis, sildenafil, tadalafil, etc)   On the Night Before the Test: Be sure to Drink plenty of water. Do not consume any caffeinated/decaffeinated beverages or chocolate 12 hours prior to your test. Do not take any antihistamines 12 hours prior to your test.  On the Day of the Test: Drink plenty of water until 1 hour prior to the test. Do not eat any food 1 hour prior to test. You may take your regular medications prior to the test.  Take metoprolol  (Lopressor ) two hours prior to test. If you take Furosemide/Hydrochlorothiazide /Spironolactone/Chlorthalidone, please HOLD on the morning of the test. Patients who wear a continuous glucose monitor MUST remove the device prior to scanning. FEMALES- please wear underwire-free bra if available,  avoid dresses & tight clothing      After the Test: Drink plenty of water. After receiving IV contrast, you may experience a mild flushed feeling. This is normal. On occasion, you may experience a mild rash up to 24 hours after the test. This is not  dangerous. If this occurs, you can take Benadryl  25 mg, Zyrtec, Claritin, or Allegra and increase your fluid intake. (Patients taking Tikosyn should avoid Benadryl , and may take Zyrtec, Claritin, or Allegra) If you experience trouble breathing, this can be serious. If it is severe call 911 IMMEDIATELY. If it is mild, please call our office.  We will call to schedule your test 2-4 weeks out understanding that some insurance companies will need an authorization prior to the service being performed.   For more information and frequently asked questions, please visit our website : [email or web address]  For non-scheduling related questions, please contact the cardiac imaging nurse navigator should you have any questions/concerns: Cardiac Imaging Nurse Navigators Direct Office Dial: [phone]   For scheduling needs, including cancellations and rescheduling, please call [place], [phone].  Cardiac CT Angiogram A cardiac CT angiogram is a procedure to look at the heart and the area around the heart. It may be done to help find the cause of chest pains or other symptoms of heart disease. During this procedure, a substance called contrast dye is injected into a vein in the arm. The contrast highlights the blood vessels in the area to be checked. A large X-ray machine (CT scanner), then takes detailed pictures of the heart and the surrounding area. The procedure is also sometimes called a coronary CT angiogram, coronary artery scanning, or CTA. A cardiac CT angiogram allows the health care provider to see how well blood is flowing to and from the heart. The provider will be able to see if there are any problems, such as: Blockage or narrowing of the arteries in the heart. Fluid around the heart. Signs of weakness or disease in the muscles, valves, and tissues of the heart. Tell a health care provider about: Any allergies you have. This is especially important if you have had a previous  allergic reaction to medicines, contrast dye, or iodine. All medicines you are taking, including vitamins, herbs, eye drops, creams, and over-the-counter medicines. Any bleeding problems you have. Any surgeries you have had. Any medical conditions you have, including kidney problems or kidney failure. Whether you are pregnant or may be pregnant. Any anxiety disorders, chronic pain, or other conditions you have. These may increase your stress or prevent you from lying still. Any history of abnormal heart rhythms or heart procedures. What are the risks? Your provider will talk with you about risks. These may include: Bleeding. Infection. Allergic reactions to medicines or dyes. Damage to other structures or organs. Kidney damage from the contrast dye. Increased risk of cancer from radiation exposure. This risk is low. Talk with your provider about: The risks and benefits of testing. How you can receive the lowest dose of radiation. What happens before the procedure? Wear comfortable clothing and remove any jewelry, glasses, dentures, and hearing aids. Follow instructions from your provider about eating and drinking. These may include: 12 hours before the procedure Avoid caffeine. This includes tea, coffee, soda, energy drinks, and diet pills. Drink plenty of water or other fluids that do not have caffeine in them. Being well hydrated can prevent complications. 4-6 hours before the procedure Stop eating and drinking. This will reduce the risk of nausea from the contrast  dye. Ask your provider about changing or stopping your regular medicines. These include: Diabetes medicines. Medicines to treat problems with erections (erectile dysfunction). If you have kidney problems, you may need to receive IV hydration before and after the test. What happens during the procedure?  Hair on your chest may need to be removed so that small sticky patches called electrodes can be placed on your chest.  These will transmit information that helps to monitor your heart during the procedure. An IV will be inserted into one of your veins. You might be given a medicine to control your heart rate during the procedure. This will help to ensure that good images are obtained. You will be asked to lie on an exam table. This table will slide in and out of the CT machine during the procedure. Contrast dye will be injected into the IV. You might feel warm, or you may get a metallic taste in your mouth. You may be given medicines to relax or dilate the arteries in your heart. If you are allergic to contrast dyes or iodine you may be given medicine before the test to reduce the risk of an allergic reaction. The table that you are lying on will move into the CT machine tunnel for the scan. The person running the machine will give you instructions while the scans are being done. You may be asked to: Keep your arms above your head. Hold your breath for short periods. Stay very still, even if the table is moving. The procedure may vary among providers and hospitals. What can I expect after the procedure? After your procedure, it is common to have: A metallic taste in your mouth from the contrast dye. A feeling of warmth. A headache from the heart medicine. Follow these instructions at home: Take over-the-counter and prescription medicines only as told by your provider. If you are told, drink enough fluid to keep your pee pale yellow. This will help to flush the contrast dye out of your body. Most people can return to their normal activities right after the procedure. Ask your provider what activities are safe for you. It is up to you to get the results of your procedure. Ask your provider, or the department that is doing the procedure, when your results will be ready. Contact a health care provider if: You have any symptoms of allergy to the contrast dye. These include: Shortness of breath. Rash or hives. A  racing heartbeat. You notice a change in your peeing (urination). This information is not intended to replace advice given to you by your health care provider. Make sure you discuss any questions you have with your health care provider. Document Revised: 03/08/2022 Document Reviewed: 03/08/2022 Elsevier Patient Education  2024 [employer].

## 2024-05-11 ENCOUNTER — Other Ambulatory Visit: Payer: Self-pay | Admitting: Nurse Practitioner

## 2024-05-11 DIAGNOSIS — F321 Major depressive disorder, single episode, moderate: Secondary | ICD-10-CM

## 2024-05-13 ENCOUNTER — Ambulatory Visit (HOSPITAL_BASED_OUTPATIENT_CLINIC_OR_DEPARTMENT_OTHER)
Admission: RE | Admit: 2024-05-13 | Discharge: 2024-05-13 | Disposition: A | Discharge status: Home / Self Care | Source: Ambulatory Visit | Attending: Cardiovascular Disease | Admitting: Cardiovascular Disease

## 2024-05-13 DIAGNOSIS — I10 Essential (primary) hypertension: Secondary | ICD-10-CM | POA: Diagnosis not present

## 2024-05-13 DIAGNOSIS — E041 Nontoxic single thyroid nodule: Secondary | ICD-10-CM | POA: Diagnosis not present

## 2024-05-13 DIAGNOSIS — E049 Nontoxic goiter, unspecified: Secondary | ICD-10-CM | POA: Diagnosis not present

## 2024-05-17 ENCOUNTER — Ambulatory Visit (HOSPITAL_BASED_OUTPATIENT_CLINIC_OR_DEPARTMENT_OTHER): Payer: Self-pay | Admitting: *Deleted

## 2024-05-18 ENCOUNTER — Telehealth (HOSPITAL_COMMUNITY): Payer: Self-pay | Admitting: *Deleted

## 2024-05-18 NOTE — Telephone Encounter (Signed)

## 2024-05-19 ENCOUNTER — Ambulatory Visit (HOSPITAL_COMMUNITY)
Admission: RE | Admit: 2024-05-19 | Discharge: 2024-05-19 | Disposition: A | Discharge status: Home / Self Care | Source: Ambulatory Visit | Attending: Cardiovascular Disease | Admitting: Cardiovascular Disease

## 2024-05-19 DIAGNOSIS — I10 Essential (primary) hypertension: Secondary | ICD-10-CM | POA: Diagnosis not present

## 2024-05-19 DIAGNOSIS — E782 Mixed hyperlipidemia: Secondary | ICD-10-CM | POA: Diagnosis not present

## 2024-05-19 DIAGNOSIS — R0609 Other forms of dyspnea: Secondary | ICD-10-CM | POA: Insufficient documentation

## 2024-05-19 LAB — ALDOSTERONE + RENIN ACTIVITY W/ RATIO
Aldos/Renin Ratio: 1.4 (ref 0.0–30.0)
Aldosterone: 5.3 ng/dL (ref 0.0–30.0)
Renin Activity, Plasma: 3.831 ng/mL/h (ref 0.167–5.380)

## 2024-05-19 LAB — TSH: TSH: 1.11 u[IU]/mL (ref 0.450–4.500)

## 2024-05-19 LAB — CORTISOL: Cortisol: 8.9 ug/dL (ref 6.2–19.4)

## 2024-05-19 MED ORDER — NITROGLYCERIN 0.4 MG SL SUBL
0.8000 mg | SUBLINGUAL_TABLET | Freq: Once | SUBLINGUAL | Status: AC
  Administered 2024-05-19: 0.8 mg via SUBLINGUAL

## 2024-05-19 MED ORDER — IOHEXOL 350 MG/ML SOLN
100.0000 mL | Freq: Once | INTRAVENOUS | Status: AC | PRN
  Administered 2024-05-19: 100 mL via INTRAVENOUS

## 2024-05-26 ENCOUNTER — Encounter (INDEPENDENT_AMBULATORY_CARE_PROVIDER_SITE_OTHER): Payer: Self-pay

## 2024-06-02 NOTE — Telephone Encounter (Signed)
Pt returning call, please advise.

## 2024-06-07 ENCOUNTER — Other Ambulatory Visit: Payer: Self-pay | Admitting: Nurse Practitioner

## 2024-06-07 NOTE — Telephone Encounter (Unsigned)
 Copied from CRM #[id number]. Topic: Clinical - Medication Refill >> Jun 07, 2024 11:01 AM [name] wrote: Medication: D-5000 125 MCG (5000 UT) TABS   Has the patient contacted their pharmacy? Yes (Agent: If no, request that the patient contact the pharmacy for the refill. If patient does not wish to contact the pharmacy document the reason why and proceed with request.) (Agent: If yes, when and what did the pharmacy advise?) No refills  This is the patient's preferred pharmacy:  [id number] - [place], [place] - [address] [place] [address] Phone: [phone] Fax: [phone]  Is this the correct pharmacy for this prescription? Yes If no, delete pharmacy and type the correct one.   Has the prescription been filled recently? Yes  Is the patient out of the medication? Yes  Has the patient been seen for an appointment in the last year OR does the patient have an upcoming appointment? Yes  Can we respond through MyChart? Yes  Agent: Please be advised that Rx refills may take up to 3 business days. We ask that you follow-up with your pharmacy.

## 2024-06-08 MED ORDER — D-5000 125 MCG (5000 UT) PO TABS: 1.0000 | ORAL_TABLET | Freq: Every day | ORAL | 0 refills | Status: DC

## 2024-06-21 ENCOUNTER — Encounter: Payer: Self-pay | Admitting: Radiology

## 2024-06-21 NOTE — Progress Notes (Addendum)
 Subjective:  Patient ID: Tammy Rush, female    DOB: 1980/11/09, 43 y.o.   MRN: [id number]  Patient Care Team: Deitra Morton Sebastian Nena, NP as PCP - General (Nurse Practitioner)   Chief Complaint:  Medical Management of Chronic Issues   HPI: Tammy Rush is a 43 y.o. female presenting on 06/22/2024 for Medical Management of Chronic Issues   Discussed the use of AI scribe software for clinical note transcription with the patient, who gave verbal consent to proceed.  History of Present Illness Tammy Rush is a 43 year old female with hypertension, vitamin D  deficiency, hyperlipidemia, depression, and iron deficiency who presents for a three-month follow-up for chronic disease management.  She is taking Zestoretic  10/12.5 mg, amlodipine  5 mg, and losartan  100 mg for hypertension. Her current blood pressure is 112/71 mmHg.  For iron deficiency anemia, she takes ferrous sulfate  325 mg. She feels 'pretty good' but notes feeling sluggish without vitamin D  supplements. She is currently on 5000 units of vitamin D .  She takes 10 mg of Lipitor for hyperlipidemia. She experiences some breathing difficulties and has not used her inhaler recently. She reported having a possible sinus infection.  She is concerned about hair loss and reports hair breakage. She asked if anemia or thyroid  issues could be related to her hair loss. She reports hair breakage. She has an upcoming appointment with endocrine   Obese with a BMI 37.35 and current working on diet and exercise      06/22/2024    8:12 AM 12/01/2023   11:34 AM 09/16/2023    8:13 AM 06/19/2023    2:42 PM  GAD 7 : Generalized Anxiety Score  Nervous, Anxious, on Edge 0 0 0 0  Control/stop worrying 0 0 3 0  Worry too much - different things 0 0 3 0  Trouble relaxing 0 0 0 0  Restless 0 0 0 0  Easily annoyed or irritable 0 0 0 0  Afraid - awful might happen 0 0 0 0  Total GAD 7 Score 0 0 6 0  Anxiety Difficulty Not  difficult at all Not difficult at all Somewhat difficult Not difficult at all        06/22/2024    8:11 AM 12/01/2023   11:33 AM 09/16/2023    8:13 AM  PHQ9 SCORE ONLY  PHQ-9 Total Score 0 0  10      Data saved with a previous flowsheet row definition     Relevant past medical, surgical, family, and social history reviewed and updated as indicated.  Allergies and medications reviewed and updated. Data reviewed: Chart in Epic.   Past Medical History:  Diagnosis Date   Hypertension    Miscarriage    Thyroid  disease     Past Surgical History:  Procedure Laterality Date   DENTAL SURGERY  09/01/2023    Social History   Socioeconomic History   Marital status: Single    Spouse name: Not on file   Number of children: 1   Years of education: Not on file   Highest education level: 12th grade  Occupational History   Not on file  Tobacco Use   Smoking status: Former    Current packs/day: 0.00    Types: Cigarettes    Quit date: 09/03/2017    Years since quitting: 6.8   Smokeless tobacco: Never  Vaping Use   Vaping status: Some Days  Substance and Sexual Activity   Alcohol use: No  Drug use: No   Sexual activity: Yes    Birth control/protection: Pill  Other Topics Concern   Not on file  Social History Narrative   Not on file   Social Drivers of Health   Financial Resource Strain: Medium Risk (06/12/2023)   Overall Financial Resource Strain (CARDIA)    Difficulty of Paying Living Expenses: Somewhat hard  Food Insecurity: No Food Insecurity (04/26/2024)   Received from [facility]   Hunger Vital Sign    Within the past 12 months, you worried that your food would run out before you got the money to buy more.: Never true    Within the past 12 months, the food you bought just didn't last and you didn't have money to get more.: Never true  Transportation Needs: No Transportation Needs (04/26/2024)   Received from [facility] - Transportation    Lack of  Transportation (Medical): No    Lack of Transportation (Non-Medical): No  Physical Activity: Insufficiently Active (06/12/2023)   Exercise Vital Sign    Days of Exercise per Week: 3 days    Minutes of Exercise per Session: 20 min  Stress: No Stress Concern Present (06/12/2023)   [employer] of Occupational Health - Occupational Stress Questionnaire    Feeling of Stress : Not at all  Social Connections: Moderately Isolated (06/12/2023)   Social Connection and Isolation Panel    Frequency of Communication with Friends and Family: More than three times a week    Frequency of Social Gatherings with Friends and Family: More than three times a week    Attends Religious Services: 1 to 4 times per year    Active Member of [employer] or Organizations: No    Attends Banker Meetings: Not on file    Marital Status: Never married  Intimate Partner Violence: Not At Risk (05/10/2024)   Humiliation, Afraid, Rape, and Kick questionnaire    Fear of Current or Ex-Partner: No    Emotionally Abused: No    Physically Abused: No    Sexually Abused: No    Outpatient Encounter Medications as of 06/22/2024  Medication Sig   albuterol (VENTOLIN HFA) 108 (90 Base) MCG/ACT inhaler Inhale 2 puffs into the lungs.   amLODipine  (NORVASC ) 5 MG tablet TAKE 1 TABLET (5 MG TOTAL) BY MOUTH DAILY.   atorvastatin  (LIPITOR) 10 MG tablet TAKE 1 TABLET BY MOUTH EVERY DAY   D-5000 125 MCG (5000 UT) TABS Take 1 tablet (5,000 Units total) by mouth daily.   escitalopram  (LEXAPRO ) 10 MG tablet TAKE 1 TABLET BY MOUTH EVERY DAY   lisinopril -hydrochlorothiazide  (ZESTORETIC ) 10-12.5 MG tablet Take 1 tablet by mouth daily.   metoprolol  tartrate (LOPRESSOR ) 100 MG tablet Take 1 tablet (100 mg total) by mouth once for 1 dose. Take 90-120 minutes prior to scan. Hold for SBP less than 110.   norethindrone (MICRONOR) 0.35 MG tablet Take 1 tablet by mouth daily.   [DISCONTINUED] ferrous sulfate  325 (65 FE) MG tablet Take 1  tablet (325 mg total) by mouth daily with breakfast.   diphenhydrAMINE  (BENADRYL ) 25 MG tablet Take 1 tablet (25 mg total) by mouth every 6 (six) hours. (Patient not taking: Reported on 06/22/2024)   ferrous sulfate  325 (65 FE) MG tablet Take 1 tablet (325 mg total) by mouth daily with breakfast.   norethindrone-ethinyl estradiol (LOESTRIN) 1-20 MG-MCG tablet Take 1 tablet by mouth every morning. (Patient not taking: Reported on 06/22/2024)   No facility-administered encounter medications on file  as of 06/22/2024.    No Known Allergies  Review of Systems  Constitutional:  Negative for chills and fever.  HENT:  Negative for congestion and sore throat.   Cardiovascular:  Negative for chest pain and leg swelling.  Gastrointestinal:  Negative for blood in stool, constipation, diarrhea, nausea and vomiting.  Musculoskeletal:  Negative for falls.  Skin:  Negative for itching and rash.  Neurological:  Negative for dizziness and headaches.  Psychiatric/Behavioral:  Negative for suicidal ideas. The patient does not have insomnia.          Objective:  BP 112/71   Temp 98.2 F (36.8 C)   Ht 5' 6 (1.676 m)   Wt 231 lb 6.4 oz (105 kg)   SpO2 100%   BMI 37.35 kg/m    Wt Readings from Last 3 Encounters:  06/22/24 231 lb 6.4 oz (105 kg)  05/10/24 230 lb (104.3 kg)  03/22/24 229 lb 6.4 oz (104.1 kg)   BP Readings from Last 3 Encounters:  06/22/24 112/71  05/19/24 120/62  05/10/24 138/84     Physical Exam Vitals and nursing note reviewed.  Constitutional:      Appearance: She is obese. She is not ill-appearing.  HENT:     Head: Normocephalic and atraumatic.     Nose: Nose normal.     Mouth/Throat:     Mouth: Mucous membranes are moist.  Eyes:     General: No scleral icterus.    Extraocular Movements: Extraocular movements intact.     Conjunctiva/sclera: Conjunctivae normal.     Pupils: Pupils are equal, round, and reactive to light.  Cardiovascular:     Heart sounds: Normal  heart sounds.  Pulmonary:     Effort: Pulmonary effort is normal.     Breath sounds: Normal breath sounds.  Musculoskeletal:        General: Normal range of motion.     Right lower leg: No edema.     Left lower leg: No edema.  Skin:    General: Skin is warm.     Findings: No rash.  Neurological:     Mental Status: She is alert and oriented to person, place, and time.  Psychiatric:        Attention and Perception: Attention and perception normal.        Mood and Affect: Mood normal.        Speech: Speech normal.        Behavior: Behavior normal. Behavior is cooperative.        Thought Content: Thought content normal. Thought content does not include homicidal or suicidal ideation. Thought content does not include homicidal or suicidal plan.        Cognition and Memory: Cognition and memory normal.        Judgment: Judgment normal.    Physical Exam VITALS: BP- 112/71     Results for orders placed or performed in visit on 05/10/24  Aldosterone + renin activity w/ ratio   Collection Time: 05/13/24  8:20 AM  Result Value Ref Range   Aldosterone 5.3 0.0 - 30.0 ng/dL   Renin Activity, Plasma 3.831 0.167 - 5.380 ng/mL/hr   Aldos/Renin Ratio 1.4 0.0 - 30.0  Cortisol   Collection Time: 05/13/24  8:20 AM  Result Value Ref Range   Cortisol 8.9 6.2 - 19.4 ug/dL  TSH   Collection Time: 05/13/24  8:20 AM  Result Value Ref Range   TSH 1.110 0.450 - 4.500 uIU/mL  Pertinent labs & imaging results that were available during my care of the patient were reviewed by me and considered in my medical decision making.  Assessment & Plan:  Tammy Rush was seen today for medical management of chronic issues.  Diagnoses and all orders for this visit:  Primary hypertension  Vitamin D  deficiency  Mixed hyperlipidemia  Depression, major, single episode, moderate (HCC)  Iron deficiency anemia, unspecified iron deficiency anemia type -     ferrous sulfate  325 (65 FE) MG tablet; Take 1  tablet (325 mg total) by mouth daily with breakfast.  Obesity, morbid (HCC)     Assessment and Plan Tammy Rush is a 43 yrs old African American female seen for chronic diseases management, no acute distress Assessment & Plan Primary hypertension Blood pressure controlled with current regimen. Recent readings within target range. - Continue Zestoretic  10/12.5 mg, Amlodipine  5 mg, Losartan  100 mg daily. - Schedule follow-up in four months.  Mixed hyperlipidemia Managed with Lipitor 10 mg daily. - Continue Lipitor 10 mg daily.  Iron deficiency anemia Managed with Ferrous sulfate  325 mg daily. Previous hair loss noted, possibly related to anemia. - Refill Ferrous sulfate  325 mg. - Monitor symptoms; evaluate if hair loss persists.  Nontoxic single thyroid  nodule Referred to endocrinology for evaluation. Hair loss may be related to thyroid  dysfunction. - Follow up with endocrinology. - Discuss hair loss with endocrinologist.  Vitamin D  deficiency Managed with 50,000 units weekly. Reports feeling well. - Continue Vitamin D  50,000 units weekly.  Depression Managed with Lexapro  10 mg daily. - Continue Lexapro  10 mg daily.   Future: labs at next appointment  Continue all other maintenance medications.  Follow up plan: Return in about 4 months (around 10/20/2024).   Continue healthy lifestyle choices, including diet (rich in fruits, vegetables, and lean proteins, and low in salt and simple carbohydrates) and exercise (at least 30 minutes of moderate physical activity daily).  Educational handout given for    Clinical References  Major Depressive Disorder, Adult Major depressive disorder (MDD) is a mental health condition. It may also be called clinical depression or unipolar depression. MDD causes symptoms of sadness, hopelessness, and loss of interest in things. These symptoms last most of the day, almost every day, for 2 weeks. MDD can also cause physical symptoms. It can  interfere with relationships and activities, such as work, school, and activities that are usually pleasant. MDD may be mild, moderate, or severe. It may be single-episode MDD, which happens once, or recurrent MDD, which may occur many times. What are the causes? The exact cause of this condition is not known. What increases the risk? The following factors may make someone more likely to develop MDD: A family history of depression. Being female. Long-term (chronic) stress, physical illness, other mental health disorders, or substance misuse. Trauma, including: Family problems. Violence or abuse. Loss of a parent or close family member. Experiencing discrimination. What are the signs or symptoms? The main symptoms of MDD usually include: Constant depressed or irritable mood. A loss of interest in activities. Sleeping or eating too much or too little. Tiredness or low energy. Other symptoms include: Unexplained weight gain or weight loss. Being agitated, restless, or weak. Feeling hopeless, worthless, or guilty. Trouble thinking clearly or making decisions. Thoughts of suicide or harming others. Spending a lot of time alone. Not being able to complete daily tasks or work. Severe symptoms of this condition may include: Psychotic depression.This may include false beliefs or delusions. It may also include seeing, hearing,  tasting, smelling, or feeling things that are not real (hallucinations). Chronic depression or persistent depressive disorder. This is low-level depression that lasts for at least 2 years. Melancholic depression, or feeling extremely sad and hopeless. Catatonic depression, which includes trouble speaking and trouble moving. Seasonal depression, which is caused by changes in the seasons. How is this diagnosed? This condition may be diagnosed based on: Your symptoms. Your medical and mental health history. A physical exam. Blood tests to rule out other  conditions. MDD is confirmed if you have either a depressed mood or loss of interest and at least four other MDD symptoms, most of the day, nearly every day, in a 2-week period. How is this treated? This condition is usually treated by mental health professionals, such as psychologists, psychiatrists, and clinical social workers. You may need more than one type of treatment. Treatment may include: Psychotherapy, also called talk therapy or counseling. Types of psychotherapy include: Cognitive behavioral therapy (CBT). This teaches you to recognize unhealthy feelings, thoughts, and behaviors, and replace them with positive thoughts and actions. Interpersonal therapy (IPT). This helps you to improve the way you communicate with others or relate to them. Family therapy. This treatment includes members of your family. Medicines to treat anxiety and depression. These medicines help to balance the brain chemicals that affect your emotions. Lifestyle changes. You may be asked to: Limit alcohol use and avoid drug use. Get regular exercise. Get plenty of sleep. Make healthy eating choices. Spend more time outdoors. Brain stimulation. This may be done if symptoms are very severe and other treatments have not worked. Examples of this treatment are electroconvulsive therapy and transcranial magnetic stimulation. Follow these instructions at home: Alcohol use Do not drink alcohol if: Your health care provider tells you not to drink. You are pregnant, may be pregnant, or are planning to become pregnant. If you drink alcohol: Limit how much you have to: 0-1 drink a day for women 0-2 drinks a day for men. Know how much alcohol is in your drink. In the [place] one drink equals one 12 oz bottle of beer (355 mL), one 5 oz glass of wine (148 mL), or one 1 oz glass of hard liquor (44 mL). Activity Exercise regularly and spend time outdoors. Find activities that you enjoy and make time to do them. Find healthy  ways to manage stress, such as: Meditation or deep breathing. Spending time in nature. Journaling. Return to your normal activities as told by your health care provider. Ask your health care provider what activities are safe for you. General instructions  Take over-the-counter and prescription medicines only as told by your health care provider. Discuss alcohol use with your health care provider. Alcohol can affect any antidepressant medicines you are taking. Discuss any drug use with your health care provider. Eat a healthy diet and get enough sleep. Consider joining a support group. Your health care provider may be able to recommend one. Keep all follow-up visits. It is important for your health care provider to check on your mood, behavior, and medicines. Your health care provider will make changes to your treatment as needed. Where to find more information [employer] on Mental Illness: nami[employer] of Mental Health: bloggercourse.com American Psychiatric Association: psychiatry.org Contact a health care provider if: Your symptoms get worse. You develop new symptoms. Get help right away if: You hurt yourself on purpose (self-harm). You have thoughts about hurting yourself or others. You have hallucinations. Get help right away if you feel like  you may hurt yourself or others, or have thoughts about taking your own life. Go to your nearest emergency room or: Call 911. Call the National Suicide Prevention Lifeline at 339-151-1967 or 988. This is open 24 hours a day. Text the Crisis Text Line at (509)033-9539. This information is not intended to replace advice given to you by your health care provider. Make sure you discuss any questions you have with your health care provider. Document Revised: 12/11/2021 Document Reviewed: 12/11/2021 Elsevier Patient Education  2024 Else[employer] Inc. Anemia  Anemia is a condition in which there are not enough red blood cells or hemoglobin  in the blood. Hemoglobin is a substance in red blood cells that carries oxygen. When you do not have enough red blood cells or hemoglobin (are anemic), your body cannot get enough oxygen, and your organs may not work properly. As a result, you may feel very tired or have other problems. What are the causes? Common causes of anemia include: Excessive bleeding. Anemia can be caused by excessive bleeding inside or outside the body, including bleeding from the intestines or from heavy menstrual periods in females. Poor nutrition. Long-lasting (chronic) kidney, thyroid , and liver disease. Bone marrow disorders, spleen problems, and blood disorders. Cancer and treatments for cancer. Human immunodeficiency virus (HIV) and acquired immunodeficiency syndrome (AIDS). Infections, medicines, and autoimmune disorders that destroy red blood cells. What are the signs or symptoms? Symptoms of this condition include: Minor weakness. Dizziness. Headache, or difficulties concentrating and sleeping. Heartbeats that feel irregular or faster than normal (palpitations). Shortness of breath, especially with exercise. Pale skin, lips, and nails, or cold hands and feet. Upset stomach (indigestion) and nausea. Symptoms may occur suddenly or develop slowly. If your anemia is mild, you may not have symptoms. How is this diagnosed? This condition is diagnosed based on blood tests, your medical history, and a physical exam. In some cases, a test may be needed in which cells are removed from the soft tissue inside of a bone and looked at under a microscope (bone marrow biopsy). Your health care provider may also check your stool (feces) for blood and may do more testing to look for the cause of your bleeding. Other tests may include: Imaging tests, such as a CT scan or MRI. A procedure to see inside your esophagus and stomach (endoscopy). The esophagus is the part of the body that moves food from your mouth to your  stomach. A procedure to see inside your colon and rectum (colonoscopy). How is this treated? Treatment for this condition depends on the cause. If you continue to lose a lot of blood, you may need to be treated at a hospital. Treatment may include: Taking supplements of iron, vitamin B12, or folic acid . Taking a hormone medicine (erythropoietin) that can help to stimulate red blood cell growth. Receiving donated blood through an IV (blood transfusion). This may be needed if you lose a lot of blood. Making changes to your diet. Having surgery to remove your spleen. Follow these instructions at home: Take over-the-counter and prescription medicines only as told by your health care provider. Take supplements only as told by your health care provider. Follow any diet instructions that you were given by your health care provider. Keep all follow-up visits. Your health care provider will want to recheck your blood tests. Contact a health care provider if: You develop new bleeding anywhere in the body. You are very weak. Get help right away if: You are short of breath. You have pain  in your abdomen or chest. You are dizzy or feel faint. You have trouble concentrating. You have bloody stools, black stools, or tarry stools. You vomit repeatedly or you vomit up blood. These symptoms may be an emergency. Get help right away. Call 911. Do not wait to see if the symptoms will go away. Do not drive yourself to the hospital. Summary Anemia is a condition in which you do not have enough red blood cells or enough of a substance in your red blood cells that carries oxygen. Symptoms may occur suddenly or develop slowly. If your anemia is mild, you may not have symptoms. This condition is diagnosed with blood tests, a medical history, and a physical exam. Other tests may be needed. Treatment for this condition depends on the cause of the anemia. This information is not intended to replace advice given  to you by your health care provider. Make sure you discuss any questions you have with your health care provider. Document Revised: 10/29/2021 Document Reviewed: 10/29/2021 Elsevier Patient Education  2024 Elsevier Inc. Iron Deficiency Anemia, Adult  Iron deficiency anemia is a condition in which the concentration of red blood cells or hemoglobin in the blood is below normal because of too little iron. Hemoglobin is a substance in red blood cells that carries oxygen to the body's tissues. When the concentration of red blood cells or hemoglobin is too low, not enough oxygen reaches these tissues. Iron deficiency anemia is usually long-lasting, and it develops over time. It may or may not cause symptoms. It is a common type of anemia. What are the causes? This condition may be caused by: Not enough iron in the diet. Abnormal absorption in the gut. Blood loss. What increases the risk? You are more likely to develop this condition if you get menstrual periods (menstruate) or are pregnant. What are the signs or symptoms? Symptoms of this condition may include: Pale skin, lips, and nail beds. Weakness, dizziness, and getting tired easily. Shortness of breath when moving or exercising. Cold hands or feet. Mild anemia may not cause any symptoms. How is this diagnosed? This condition is diagnosed based on: Your medical history. A physical exam. Blood tests. How is this treated? This condition is treated by correcting the cause of your iron deficiency. Treatment may involve: Adding iron-rich foods to your diet. Taking iron supplements. If you are pregnant or breastfeeding, you may need to take extra iron because your normal diet usually does not provide the amount of iron that you need. Increasing vitamin C intake. Vitamin C helps your body absorb iron. Your health care provider may recommend that you take iron supplements along with a glass of orange juice or a vitamin C supplement. Medicines  to make heavy menstrual flow lighter. Surgery or additional testing procedures to determine the cause of your anemia. You may need repeat blood tests to determine whether treatment is working. If the treatment does not seem to be working, you may need more tests. Follow these instructions at home: Medicines Take over-the-counter and prescription medicines only as told by your health care provider. This includes iron supplements and vitamins. This is important because too much iron can be harmful. For the best iron absorption, you should take iron supplements when your stomach is empty. If you cannot tolerate them on an empty stomach, you may need to take them with food. Do not drink milk or take antacids at the same time as your iron supplements. Milk and antacids may interfere with how your  body absorbs iron. Iron supplements may turn stool (feces) a darker color and it may appear black. If you cannot tolerate taking iron supplements by mouth, talk with your health care provider about taking them through an IV or through an injection into a muscle. Eating and drinking Talk with your health care provider before changing your diet. Your provider may recommend that you eat foods that contain a lot of iron, such as: Liver. Low-fat (lean) beef. Breads and cereals that have iron added to them (are fortified). Eggs. Dried fruit. Dark green, leafy vegetables. To help your body use the iron from iron-rich foods, eat those foods at the same time as fresh fruits and vegetables that are high in vitamin C. Foods that are high in vitamin C include: Oranges. Peppers. Tomatoes. Mangoes. Managing constipation If you are taking an iron supplement, it may cause constipation. To prevent or treat constipation, you may need to: Drink enough fluid to keep your urine pale yellow. Take over-the-counter or prescription medicines. Eat foods that are high in fiber, such as beans, whole grains, and fresh fruits and  vegetables. Limit foods that are high in fat and processed sugars, such as fried or sweet foods. General instructions Return to your normal activities as told by your health care provider. Ask your health care provider what activities are safe for you. Keep all follow-up visits. Contact a health care provider if: You feel nauseous or you vomit. You feel weak. You become light-headed when getting up from a sitting or lying down position. You have unexplained sweating. You develop symptoms of constipation. You have a heaviness in your chest. You have trouble breathing with physical activity. Get help right away if: You faint. If this happens, do not drive yourself to the hospital. You have an irregular or rapid heartbeat. Summary Iron deficiency anemia is a condition in which the concentration of red blood cells or hemoglobin in the blood is below normal because of too little iron. This condition is treated by correcting the cause of your iron deficiency. Take over-the-counter and prescription medicines only as told by your health care provider. This includes iron supplements and vitamins. To help your body use the iron from iron-rich foods, eat those foods at the same time as fresh fruits and vegetables that are high in vitamin C. Seek medical help if you have signs or symptoms of worsening anemia. This information is not intended to replace advice given to you by your health care provider. Make sure you discuss any questions you have with your health care provider. Document Revised: 09/12/2021 Document Reviewed: 09/12/2021 Elsevier Patient Education  2024 [employer] Dyslipidemia Dyslipidemia is an imbalance of waxy, fat-like substances (lipids) in the blood. The body needs lipids in small amounts. Dyslipidemia often involves a high level of cholesterol or triglycerides, which are types of lipids. Common forms of dyslipidemia include: High levels of LDL cholesterol. LDL is the type of  cholesterol that causes fatty deposits (plaques) to build up in the blood vessels that carry blood away from the heart (arteries). Low levels of HDL cholesterol. HDL cholesterol is the type of cholesterol that protects against heart disease. High levels of HDL remove the LDL buildup from arteries. High levels of triglycerides. Triglycerides are a fatty substance in the blood that is linked to a buildup of plaques in the arteries. What are the causes? There are two main types of dyslipidemia: primary and secondary. Primary dyslipidemia is caused by changes (mutations) in genes that are passed down  through families (inherited). These mutations cause several types of dyslipidemia. Secondary dyslipidemia may be caused by various risk factors that can lead to the disease, such as lifestyle choices and certain medical conditions. What increases the risk? You are more likely to develop this condition if you are an older man or if you are a woman who has gone through menopause. Other risk factors include: Having a family history of dyslipidemia. Taking certain medicines, including birth control pills, steroids, some diuretics, and beta-blockers. Eating a diet high in saturated fat. Smoking cigarettes or excessive alcohol intake. Having certain medical conditions such as diabetes, polycystic ovary syndrome (PCOS), kidney disease, liver disease, or hypothyroidism. Not exercising regularly. Being overweight or obese with too much belly fat. What are the signs or symptoms? In most cases, dyslipidemia does not usually cause any symptoms. In severe cases, very high lipid levels can cause: Fatty bumps under the skin (xanthomas). A white or gray ring around the black center (pupil) of the eye. Very high triglyceride levels can cause inflammation of the pancreas (pancreatitis). How is this diagnosed? Your health care provider may diagnose dyslipidemia based on a routine blood test (fasting blood test). Because  most people do not have symptoms of the condition, this blood testing (lipid profile) is done on adults age 34 and older and is repeated every 4-6 years. This test checks: Total cholesterol. This measures the total amount of cholesterol in your blood, including LDL cholesterol, HDL cholesterol, and triglycerides. A healthy number is below 200 mg/dL (4.82 mmol/L). LDL cholesterol. The target number for LDL cholesterol is different for each person, depending on individual risk factors. A healthy number is usually below 100 mg/dL (7.40 mmol/L). Ask your health care provider what your LDL cholesterol should be. HDL cholesterol. An HDL level of 60 mg/dL (8.44 mmol/L) or higher is best because it helps to protect against heart disease. A number below 40 mg/dL (8.96 mmol/L) for men or below 50 mg/dL (8.70 mmol/L) for women increases the risk for heart disease. Triglycerides. A healthy triglyceride number is below 150 mg/dL (8.30 mmol/L). If your lipid profile is abnormal, your health care provider may do other blood tests. How is this treated? Treatment depends on the type of dyslipidemia that you have and your other risk factors for heart disease and stroke. Your health care provider will have a target range for your lipid levels based on this information. Treatment for dyslipidemia starts with lifestyle changes, such as diet and exercise. Your health care provider may recommend that you: Get regular exercise. Make changes to your diet. Quit smoking if you smoke. Limit your alcohol intake. If diet changes and exercise do not help you reach your goals, your health care provider may also prescribe medicine to lower lipids. The most commonly prescribed type of medicine lowers your LDL cholesterol (statin drug). If you have a high triglyceride level, your provider may prescribe another type of drug (fibrate) or an omega-3 fish oil supplement, or both. Follow these instructions at home: Eating and  drinking  Follow instructions from your health care provider or dietitian about eating or drinking restrictions. Eat a healthy diet as told by your health care provider. This can help you reach and maintain a healthy weight, lower your LDL cholesterol, and raise your HDL cholesterol. This may include: Limiting your calories, if you are overweight. Eating more fruits, vegetables, whole grains, fish, and lean meats. Limiting saturated fat, trans fat, and cholesterol. Do not drink alcohol if: Your health care provider  tells you not to drink. You are pregnant, may be pregnant, or are planning to become pregnant. If you drink alcohol: Limit how much you have to: 0-1 drink a day for women. 0-2 drinks a day for men. Know how much alcohol is in your drink. In the [place] one drink equals one 12 oz bottle of beer (355 mL), one 5 oz glass of wine (148 mL), or one 1 oz glass of hard liquor (44 mL). Activity Get regular exercise. Start an exercise and strength training program as told by your health care provider. Ask your health care provider what activities are safe for you. Your health care provider may recommend: 30 minutes of aerobic activity 4-6 days a week. Brisk walking is an example of aerobic activity. Strength training 2 days a week. General instructions Do not use any products that contain nicotine or tobacco. These products include cigarettes, chewing tobacco, and vaping devices, such as e-cigarettes. If you need help quitting, ask your health care provider. Take over-the-counter and prescription medicines only as told by your health care provider. This includes supplements. Keep all follow-up visits. This is important. Contact a health care provider if: You are having trouble sticking to your exercise or diet plan. You are struggling to quit smoking or to control your use of alcohol. Summary Dyslipidemia often involves a high level of cholesterol or triglycerides, which are types of  lipids. Treatment depends on the type of dyslipidemia that you have and your other risk factors for heart disease and stroke. Treatment for dyslipidemia starts with lifestyle changes, such as diet and exercise. Your health care provider may prescribe medicine to lower lipids. This information is not intended to replace advice given to you by your health care provider. Make sure you discuss any questions you have with your health care provider. Document Revised: 03/08/2022 Document Reviewed: 10/09/2020 Elsevier Patient Education  2025 [employer] Blood Pressure Record Sheet To take your blood pressure, you will need a blood pressure machine. You may be prescribed one, or you can buy a blood pressure machine (blood pressure monitor) at your clinic, drug store, or online. When choosing one, look for these features: An automatic monitor that has an arm cuff. A cuff that wraps snugly, but not too tightly, around your upper arm. You should be able to fit only one finger between your arm and the cuff. A device that stores blood pressure reading results. Do not choose a monitor that measures your blood pressure from your wrist or finger. Follow your health care provider's instructions for how to take your blood pressure. To use this form: Get one reading in the morning (a.m.) before you take any medicines. Get one reading in the evening (p.m.) before supper. Take at least two readings with each blood pressure check. This makes sure the results are correct. Wait 1-2 minutes between measurements. Write down the results in the spaces on this form. Repeat this once a week, or as told by your health care provider. Make a follow-up appointment with your health care provider to discuss the results. Blood pressure log Date: _______________________ a.m. _____________________(1st reading) _____________________(2nd reading) p.m. _____________________(1st reading) _____________________(2nd reading) Date:  _______________________ a.m. _____________________(1st reading) _____________________(2nd reading) p.m. _____________________(1st reading) _____________________(2nd reading) Date: _______________________ a.m. _____________________(1st reading) _____________________(2nd reading) p.m. _____________________(1st reading) _____________________(2nd reading) Date: _______________________ a.m. _____________________(1st reading) _____________________(2nd reading) p.m. _____________________(1st reading) _____________________(2nd reading) Date: _______________________ a.m. _____________________(1st reading) _____________________(2nd reading) p.m. _____________________(1st reading) _____________________(2nd reading) This information is not intended to replace advice given to  you by your health care provider. Make sure you discuss any questions you have with your health care provider. Document Revised: 04/19/2021 Document Reviewed: 04/19/2021 Elsevier Patient Education  2024 [employer] Inc[employer]  The above assessment and management plan was discussed with the patient. The patient verbalized understanding of and has agreed to the management plan. Patient is aware to call the clinic if they develop any new symptoms or if symptoms persist or worsen. Patient is aware when to return to the clinic for a follow-up visit. Patient educated on when it is appropriate to go to the emergency department.   Jewelene Mairena [place] Thompson, DNP [facility] [address] [place], [place] [address] [phone]

## 2024-06-22 ENCOUNTER — Ambulatory Visit: Payer: Self-pay | Admitting: Nurse Practitioner

## 2024-06-22 ENCOUNTER — Encounter: Payer: Self-pay | Admitting: Nurse Practitioner

## 2024-06-22 VITALS — BP 112/71 | Temp 98.2°F | Ht 66.0 in | Wt 231.4 lb

## 2024-06-22 DIAGNOSIS — E782 Mixed hyperlipidemia: Secondary | ICD-10-CM

## 2024-06-22 DIAGNOSIS — I10 Essential (primary) hypertension: Secondary | ICD-10-CM

## 2024-06-22 DIAGNOSIS — E559 Vitamin D deficiency, unspecified: Secondary | ICD-10-CM | POA: Diagnosis not present

## 2024-06-22 DIAGNOSIS — F321 Major depressive disorder, single episode, moderate: Secondary | ICD-10-CM

## 2024-06-22 DIAGNOSIS — D509 Iron deficiency anemia, unspecified: Secondary | ICD-10-CM | POA: Diagnosis not present

## 2024-06-22 MED ORDER — FERROUS SULFATE 325 (65 FE) MG PO TABS: 325.0000 mg | ORAL_TABLET | Freq: Every day | ORAL | 1 refills | Status: AC

## 2024-06-22 NOTE — Assessment & Plan Note (Signed)
 Diet and exercise; 15-30 mins walk 3-4 times weekly.

## 2024-06-27 ENCOUNTER — Other Ambulatory Visit: Payer: Self-pay | Admitting: Nurse Practitioner

## 2024-06-27 DIAGNOSIS — I10 Essential (primary) hypertension: Secondary | ICD-10-CM

## 2024-07-06 ENCOUNTER — Ambulatory Visit (HOSPITAL_BASED_OUTPATIENT_CLINIC_OR_DEPARTMENT_OTHER): Admitting: Family

## 2024-07-07 ENCOUNTER — Other Ambulatory Visit: Payer: Self-pay | Admitting: Nurse Practitioner

## 2024-07-07 DIAGNOSIS — E782 Mixed hyperlipidemia: Secondary | ICD-10-CM

## 2024-07-08 NOTE — Patient Instructions (Signed)
Goiter  A goiter is an enlarged thyroid gland. The thyroid gland is located in the lower front part of the neck, just in front of the windpipe (trachea). This gland makes hormones that affect how the body processes food for energy (metabolism) and how the heart and brain function. Most goiters are painless and are not a cause for concern. Some goiters can affect the way your thyroid makes thyroid hormones. Goiters and conditions that cause goiters can be treated, if necessary. What are the causes? This condition may be caused by: Lack of a mineral called iodine. The thyroid gland uses iodine to make thyroid hormones. Diseases that attack healthy cells in the body (autoimmune diseases) and affect thyroid function, such as Graves' disease or Hashimoto's disease. These diseases may cause the body to produce too much thyroid hormone (hyperthyroidism) or too little of the hormone (hypothyroidism). Conditions that cause inflammation of the thyroid (thyroiditis). One or more small growths on the thyroid (nodular goiter). Other causes may include: Medical problems caused by abnormal genes that are passed from parent to child (genetic defects). Thyroid injury or infection. Tumors that may or may not be cancerous. Pregnancy. Certain medicines. Exposure to radiation. In some cases, the cause may not be known. What increases the risk? The following factors may make you more likely to develop this condition: You do not get enough iodine in your diet. You have a family history of goiter. You are female. You are older than age 40. You smoke tobacco. You have had exposure to radiation. What are the signs or symptoms? The main symptom of this condition is swelling in the lower, front part of the neck. This swelling can range from a very small bump to a large lump. Other symptoms may include: A tight feeling in the throat. A hoarse voice. Coughing. Wheezing. Difficulty swallowing or breathing. Bulging  veins in the neck. Dizziness. When a goiter is the result of an overactive thyroid (hyperthyroidism), symptoms may also include: Nervousness or restlessness. Inability to tolerate heat. Unexplained weight loss. Diarrhea. Changes in heartbeat, such as skipped beats, extra beats, or a rapid heart rate. Loss of menstruation. Increased appetite. Sleep problems. When a goiter is the result of an underactive thyroid (hypothyroidism), symptoms may also include: Feeling tired (fatigue). Inability to tolerate cold. Weight gain that is not explained by a change in diet or exercise habits. Dry skin or coarse hair. Irregular menstrual periods. Constipation. Sadness or depression. In some cases, there may not be any symptoms. How is this diagnosed? This condition may be diagnosed based on your symptoms, your medical history, and a physical exam. You may have tests, such as: Blood tests to check thyroid function. Imaging tests, such as: Ultrasound. CT scan. MRI. Thyroid scan. Removal of a tissue sample (biopsy) of the goiter or any nodules. The sample will be tested to check for cancer. How is this treated? Treatment for this condition depends on the cause and your symptoms. Treatment may include: Medicines to regulate thyroid hormone levels. Anti-inflammatory medicines or steroid medicines, if the goiter is caused by inflammation. Iodine supplements or changes to your diet, if the goiter is caused by iodine deficiency. Radioactive iodine treatment. Surgery to remove your thyroid. In some cases, you may only need regular check-ups with your health care provider to monitor your condition, and you may not need treatment. Follow these instructions at home: Follow instructions from your health care provider about any changes to your diet. Take over-the-counter and prescription medicines only as told   by your health care provider. These include supplements. Do not use any products that contain  nicotine or tobacco. These products include cigarettes, chewing tobacco, and vaping devices, such as e-cigarettes. If you need help quitting, ask your health care provider. Keep all follow-up visits. Your health care provider will want to repeat blood tests to check thyroid function. Where to find more information American Thyroid Association: thyroid.org Endocrine Society: endocrine.org Contact a health care provider if: Your symptoms do not get better with treatment. You have nausea, vomiting, or diarrhea. You have a fever. You suddenly become very weak. You experience extreme restlessness. Get help right away if: You have sudden, unexplained confusion or other mental changes. You have chest pain. You have trouble breathing or swallowing. You have fast or irregular heartbeats (palpitations). These symptoms may be an emergency. Get help right away. Call 911. Do not wait to see if the symptoms will go away. Do not drive yourself to the hospital. Summary A goiter is an enlarged thyroid gland. The thyroid gland is located in the lower front part of the neck, just in front of the windpipe. The main symptom of this condition is swelling in the lower, front part of the neck. This swelling can range from a very small bump to a large lump. Treatment for this condition depends on the cause and your symptoms. You may need medicines, supplements, or regular monitoring of your condition. This information is not intended to replace advice given to you by your health care provider. Make sure you discuss any questions you have with your health care provider. Document Revised: 09/28/2021 Document Reviewed: 09/28/2021 Elsevier Patient Education  2024 [employer].

## 2024-07-12 ENCOUNTER — Encounter: Payer: Self-pay | Admitting: Nurse Practitioner

## 2024-07-12 ENCOUNTER — Ambulatory Visit (INDEPENDENT_AMBULATORY_CARE_PROVIDER_SITE_OTHER): Admitting: Nurse Practitioner

## 2024-07-12 VITALS — BP 130/82 | HR 74 | Ht 67.0 in | Wt 232.8 lb

## 2024-07-12 DIAGNOSIS — E049 Nontoxic goiter, unspecified: Secondary | ICD-10-CM | POA: Diagnosis not present

## 2024-07-12 NOTE — Progress Notes (Signed)
 Endocrinology Consult Note 07/12/24    ---------------------------------------------------------------------------------------------------------------------- Subjective    Past Medical History:  Diagnosis Date   Hypertension    Miscarriage    Thyroid  disease     Past Surgical History:  Procedure Laterality Date   DENTAL SURGERY  09/01/2023    Social History   Socioeconomic History   Marital status: Single    Spouse name: Not on file   Number of children: 1   Years of education: Not on file   Highest education level: 12th grade  Occupational History   Not on file  Tobacco Use   Smoking status: Former    Current packs/day: 0.00    Types: Cigarettes    Quit date: 09/03/2017    Years since quitting: 6.8   Smokeless tobacco: Never  Vaping Use   Vaping status: Some Days  Substance and Sexual Activity   Alcohol use: No   Drug use: No   Sexual activity: Yes    Birth control/protection: Pill  Other Topics Concern   Not on file  Social History Narrative   Not on file   Social Drivers of Health   Financial Resource Strain: Medium Risk (06/12/2023)   Overall Financial Resource Strain (CARDIA)    Difficulty of Paying Living Expenses: Somewhat hard  Food Insecurity: No Food Insecurity (04/26/2024)   Received from [facility]   Hunger Vital Sign    Within the past 12 months, you worried that your food would run out before you got the money to buy more.: Never true    Within the past 12 months, the food you bought just didn't last and you didn't have money to get more.: Never true  Transportation Needs: No Transportation Needs (04/26/2024)   Received from [facility]   PRAPARE - Transportation    Lack of Transportation (Medical): No    Lack of Transportation (Non-Medical): No  Physical Activity: Insufficiently Active (06/12/2023)   Exercise Vital Sign    Days of Exercise per Week: 3 days    Minutes of Exercise per Session: 20 min  Stress: No Stress Concern  Present (06/12/2023)   [employer] of Occupational Health - Occupational Stress Questionnaire    Feeling of Stress : Not at all  Social Connections: Moderately Isolated (06/12/2023)   Social Connection and Isolation Panel    Frequency of Communication with Friends and Family: More than three times a week    Frequency of Social Gatherings with Friends and Family: More than three times a week    Attends Religious Services: 1 to 4 times per year    Active Member of [employer] or Organizations: No    Attends Engineer, Structural: Not on file    Marital Status: Never married  Intimate Partner Violence: Not At Risk (05/10/2024)   Humiliation, Afraid, Rape, and Kick questionnaire    Fear of Current or Ex-Partner: No    Emotionally Abused: No    Physically Abused: No    Sexually Abused: No    Current Outpatient Medications on File Prior to Visit  Medication Sig Dispense Refill   amLODipine  (NORVASC ) 5 MG tablet TAKE 1 TABLET (5 MG TOTAL) BY MOUTH DAILY. 90 tablet 0   atorvastatin  (LIPITOR) 10 MG tablet TAKE 1 TABLET BY MOUTH EVERY DAY 90 tablet 1   D-5000 125 MCG (5000 UT) TABS Take 1 tablet (5,000 Units total) by mouth daily. 90 tablet 0   escitalopram  (LEXAPRO ) 10 MG tablet TAKE 1 TABLET BY MOUTH EVERY DAY 90  tablet 0   ferrous sulfate  325 (65 FE) MG tablet Take 1 tablet (325 mg total) by mouth daily with breakfast. 90 tablet 1   lisinopril -hydrochlorothiazide  (ZESTORETIC ) 10-12.5 MG tablet TAKE 1 TABLET BY MOUTH EVERY DAY 90 tablet 0   norethindrone (MICRONOR) 0.35 MG tablet Take 1 tablet by mouth daily.     No current facility-administered medications on file prior to visit.      HPI   Tammy Rush is a 43 y.o.-year-old female, referred by her PCP, for evaluation for goiter.  Thyroid  U/S: 05/13/24 CLINICAL DATA:  Thyroid  nodule   EXAM: THYROID  ULTRASOUND   TECHNIQUE: Ultrasound examination of the thyroid  gland and adjacent soft tissues was performed.    COMPARISON:  04/08/2023   FINDINGS: Parenchymal Echotexture: Markedly heterogenous   Isthmus: 3.0 cm   Right lobe: 11.9 x 6.4 x 6.0 cm   Left lobe: 10.0 x 5.1 x 6.1 cm   _________________________________________________________   Estimated total number of nodules >/= 1 cm: 0   Number of spongiform nodules >/=  2 cm not described below (TR1): 0   Number of mixed cystic and solid nodules >/= 1.5 cm not described below (TR2): 0   _________________________________________________________   No discrete nodules are seen within the thyroid  gland.   IMPRESSION: Markedly enlarged, heterogeneous thyroid  without discrete nodule.   The above is in keeping with the ACR TI-RADS recommendations - J Am Coll Radiol 2017;14:587-595.     Electronically Signed   By: Aliene Lloyd M.D.   On: 05/13/2024 17:26  I reviewed pt's thyroid  tests: Lab Results  Component Value Date   TSH 1.110 05/13/2024   TSH 0.901 05/14/2023     Pt c/o: - fatigue - SOB -worse when laying down - intermittent tremors - weight gain - dry skin - hair loss- has bald spot on back of head - intermittent dysphagia- on certain shaped pills  She notes these symptoms have been going on for about 4-5 years, getting worse.  She previously did not have insurance thus was unable to afford the work up.  No FH of thyroid  ds. No FH of thyroid  cancer. No h/o radiation tx to head or neck.  No seaweed or kelp. No recent contrast studies. No steroid use. No herbal supplements. No Biotin supplements or Hair, Skin and Nails vitamins.  Pt also has a history of vitamin d  deficiency, HLD, anemia.  Review of systems  Constitutional: + increasing body weight,  current Body mass index is 36.46 kg/m. , + fatigue, no subjective hyperthermia, no subjective hypothermia Eyes: no blurry vision, no xerophthalmia ENT: no sore throat, no nodules palpated in throat, + intermittent dysphagia/odynophagia, no hoarseness Cardiovascular:  no chest pain, no shortness of breath, no palpitations, no leg swelling Respiratory: no cough, + shortness of breath- worse when laying down Gastrointestinal: no nausea/vomiting/diarrhea Musculoskeletal: no muscle/joint aches Skin: no rashes, no hyperemia, + dry skin, + hair loss Neurological: + intermittent tremors, no numbness, no tingling, no dizziness Psychiatric: no depression, no anxiety  ---------------------------------------------------------------------------------------------------------------------- Objective    BP 130/82 (BP Location: Left Arm, Patient Position: Sitting, Cuff Size: Large)   Pulse 74   Ht 5' 7 (1.702 m)   Wt 232 lb 12.8 oz (105.6 kg)   BMI 36.46 kg/m    BP Readings from Last 3 Encounters:  07/12/24 130/82  06/22/24 112/71  05/19/24 120/62    Wt Readings from Last 3 Encounters:  07/12/24 232 lb 12.8 oz (105.6 kg)  06/22/24 231 lb 6.4  oz (105 kg)  05/10/24 230 lb (104.3 kg)     Physical Exam- Limited  Constitutional:  Body mass index is 36.46 kg/m. , not in acute distress, normal state of mind Eyes:  EOMI, no exophthalmos Neck: Supple Thyroid : +++ gross goiter, firm gland R>L Cardiovascular: RRR, no murmurs, rubs, or gallops, no edema Respiratory: Adequate breathing efforts, no crackles, rales, rhonchi, or wheezing, + noisy breathing-stertor Musculoskeletal: no gross deformities, strength intact in all four extremities, no gross restriction of joint movements Skin:  no rashes, no hyperemia Neurological: no tremor with outstretched hands   ----------------------------------------------------------------------------------------------------------------------  ASSESSMENT / PLAN:  1. Goiter  Reviewed recent ultrasound with patient.  Her thyroid  is significantly enlarged, no nodularity noted, getting larger when compared to her previous ultrasound.    Her baseline TSH level was normal in the past.  Will check more comprehensive thyroid  labs,  including antibody testing to assess for autoimmune thyroid  hormone dysfunction.  She does have moderate compressive symptoms.  Will go ahead and refer to Dr. Eletha to discuss total thyroidectomy.  She is aware that following the removal, she will be started on thyroid  hormone replacement therapy for which she will be on for the rest of her life, and she agrees to proceed.   Follow Up Plan: Return labs beginning of next week, will call with results.  referring to Gerkin for removal.    I spent 45 minutes in the care of the patient today including review of labs from Thyroid  Function, CMP, and other relevant labs ; imaging/biopsy records (current and previous including abstractions from other facilities); face-to-face time discussing  her lab results and symptoms, medications doses, her options of short and long term treatment based on the latest standards of care / guidelines;   and documenting the encounter.  Tammy Rush  participated in the discussions, expressed understanding, and voiced agreement with the above plans.  All questions were answered to her satisfaction. she is encouraged to contact clinic should she have any questions or concerns prior to her return visit.    Benton Rio, [facility] [address] [place], [place] [address] Phone: [phone] Fax: [phone]

## 2024-07-18 ENCOUNTER — Other Ambulatory Visit: Payer: Self-pay | Admitting: Nurse Practitioner

## 2024-07-18 DIAGNOSIS — I10 Essential (primary) hypertension: Secondary | ICD-10-CM

## 2024-07-19 ENCOUNTER — Encounter (HOSPITAL_BASED_OUTPATIENT_CLINIC_OR_DEPARTMENT_OTHER): Payer: Self-pay | Admitting: Family

## 2024-07-19 ENCOUNTER — Ambulatory Visit (INDEPENDENT_AMBULATORY_CARE_PROVIDER_SITE_OTHER): Admitting: Family

## 2024-07-19 ENCOUNTER — Other Ambulatory Visit

## 2024-07-19 VITALS — BP 138/70 | HR 83 | Ht 67.0 in | Wt 232.0 lb

## 2024-07-19 DIAGNOSIS — E049 Nontoxic goiter, unspecified: Secondary | ICD-10-CM

## 2024-07-19 DIAGNOSIS — I10 Essential (primary) hypertension: Secondary | ICD-10-CM

## 2024-07-19 MED ORDER — COMFORT TOUCH BP CUFF/LARGE MISC: 1.0000 [IU] | Freq: Once | 0 refills | Status: AC

## 2024-07-19 NOTE — Progress Notes (Signed)
 Advanced [facility]pertension [facility] Assessment:    Date:  07/19/2024   ID:  Tammy Rush, DOB August 06, 1981, MRN [id number]  PCP:  Deitra Morton Sebastian Nena, NP  Cardiologist:  None  Nephrologist:  Referring MD: Deitra Morton Sebastian, Thurmon*   CC: Hypertension  History of Present Illness:    Tammy Rush is a 43 y.o. female with a hx of HTN, HLD, goiter here to follow up in the [facility] Hypertension Clinic.   Tammy Rush was diagnosed with hypertension 18 years ago, about 3 years after pregnancy. At initial visit BP uncontrolled despite amlodipine , lisinopril , hydrochlorothiazide . At [facility] initial visit 05/10/24 home readings were at goal, white coat hypertension suspected. Renin-aldosterone and cortisol unremarkable. Cardiac CTA with calcium  score of 0. Thyroid  US  with markedly enlarged, heterogenous thyroid  without discrete nodule. She was referred to endorcinology and established care 07/12/24. She has been referred to Dr. Eletha for consideration of total thyroidectomy given moderate compressive symptoms.   Presents today for follow up. Feeling overall well since last seen. Does note dyspnea with activity which she attributes to her goiter. Her SBP at home is routinely 120-132. Home BP cuff found to be inaccurate, it had a reading of 165/100 versus manual recheck 138/70. Occasional vertigo resolved with Meclizine . No chest pain, LE edema, orthopnea, PND. Stays active in her role working at at [employer].   Previous antihypertensives:   Past Medical History:  Diagnosis Date   Hypertension    Miscarriage    Thyroid  disease     Past Surgical History:  Procedure Laterality Date   DENTAL SURGERY  09/01/2023    Current Medications: Current Meds  Medication Sig   amLODipine  (NORVASC ) 5 MG tablet TAKE 1 TABLET (5 MG TOTAL) BY MOUTH DAILY.   atorvastatin  (LIPITOR) 10 MG tablet TAKE 1 TABLET BY MOUTH EVERY DAY   D-5000 125 MCG (5000 UT) TABS Take 1 tablet  (5,000 Units total) by mouth daily.   escitalopram  (LEXAPRO ) 10 MG tablet TAKE 1 TABLET BY MOUTH EVERY DAY   ferrous sulfate  325 (65 FE) MG tablet Take 1 tablet (325 mg total) by mouth daily with breakfast.   lisinopril -hydrochlorothiazide  (ZESTORETIC ) 10-12.5 MG tablet TAKE 1 TABLET BY MOUTH EVERY DAY   norethindrone (MICRONOR) 0.35 MG tablet Take 1 tablet by mouth daily.     Allergies:   Patient has no known allergies.   Social History   Socioeconomic History   Marital status: Single    Spouse name: Not on file   Number of children: 1   Years of education: Not on file   Highest education level: 12th grade  Occupational History   Not on file  Tobacco Use   Smoking status: Former    Current packs/day: 0.00    Types: Cigarettes    Quit date: 09/03/2017    Years since quitting: 6.8    Passive exposure: Never   Smokeless tobacco: Never  Vaping Use   Vaping status: Some Days  Substance and Sexual Activity   Alcohol use: No   Drug use: No   Sexual activity: Yes    Birth control/protection: Pill  Other Topics Concern   Not on file  Social History Narrative   Not on file   Social Drivers of Health   Financial Resource Strain: Medium Risk (06/12/2023)   Overall Financial Resource Strain (CARDIA)    Difficulty of Paying Living Expenses: Somewhat hard  Food Insecurity: No Food Insecurity (04/26/2024)   Received from [facility]  Hunger Vital Sign    Within the past 12 months, you worried that your food would run out before you got the money to buy more.: Never true    Within the past 12 months, the food you bought just didn't last and you didn't have money to get more.: Never true  Transportation Needs: No Transportation Needs (04/26/2024)   Received from [facility] - Transportation    Lack of Transportation (Medical): No    Lack of Transportation (Non-Medical): No  Physical Activity: Insufficiently Active (06/12/2023)   Exercise Vital Sign    Days of  Exercise per Week: 3 days    Minutes of Exercise per Session: 20 min  Stress: No Stress Concern Present (06/12/2023)   [employer] of Occupational Health - Occupational Stress Questionnaire    Feeling of Stress : Not at all  Social Connections: Moderately Isolated (06/12/2023)   Social Connection and Isolation Panel    Frequency of Communication with Friends and Family: More than three times a week    Frequency of Social Gatherings with Friends and Family: More than three times a week    Attends Religious Services: 1 to 4 times per year    Active Member of [employer] or Organizations: No    Attends Engineer, Structural: Not on file    Marital Status: Never married     Family History: The patient's family history includes CVA in her mother; Diabetes in her mother; Hypertension in her father and mother; Kidney disease in her mother. There is no history of Breast cancer.  ROS:   Please see the history of present illness.     All other systems reviewed and are negative.  EKGs/Labs/Other Studies Reviewed:         Recent Labs: 11/27/2023: ALT 16; BUN 11; Creatinine, Ser 0.73; Potassium 3.8; Sodium 135 12/16/2023: Hemoglobin 12.0; Platelets 423 05/13/2024: TSH 1.110   Recent Lipid Panel    Component Value Date/Time   CHOL 228 (H) 09/16/2023 0842   TRIG 97 09/16/2023 0842   HDL 48 09/16/2023 0842   CHOLHDL 4.8 (H) 09/16/2023 0842   LDLCALC 163 (H) 09/16/2023 0842    Physical Exam:   VS:  BP 138/70 (BP Location: Right Arm)   Pulse 83   Ht 5' 7 (1.702 m)   Wt 232 lb (105.2 kg)   SpO2 99%   BMI 36.34 kg/m  , BMI Body mass index is 36.34 kg/m.  Vitals:   07/19/24 1321 07/19/24 1348  BP: (!) 150/84 138/70  Pulse: 83   Height: 5' 7 (1.702 m)   Weight: 232 lb (105.2 kg)   SpO2: 99%   BMI (Calculated): 36.33     GENERAL:  Well appearing, overweight HEENT: Pupils equal round and reactive, fundi not visualized, oral mucosa unremarkable NECK:  No jugular venous  distention, waveform within normal limits, carotid upstroke brisk and symmetric, no bruits, no thyromegaly LYMPHATICS:  No cervical adenopathy LUNGS:  Clear to auscultation bilaterally HEART:  RRR.  PMI not displaced or sustained,S1 and S2 within normal limits, no S3, no S4, no clicks, no rubs, no murmurs ABD:  Flat, positive bowel sounds normal in frequency in pitch, no bruits, no rebound, no guarding, no midline pulsatile mass, no hepatomegaly, no splenomegaly EXT:  2 plus pulses throughout, no edema, no cyanosis no clubbing SKIN:  No rashes no nodules NEURO:  Cranial nerves II through XII grossly intact, motor grossly intact throughout PSYCH:  Cognitively intact, oriented to  person place and time   ASSESSMENT/PLAN:    White coat hypertension superimposed on hypertension - initial BP in clinic 150/84 which improved to 138/70 without intervention. Known white coat hypertension. Home BP cuff found to read higher than manual today. Home SBP 120s-140. Anticipate BP controlled at home.  Rx for home BP cuff sent to [facility] for delivery through her Medicaid benefit.  Discussed to monitor BP at home at least 2 hours after medications and sitting for 5-10 minutes.  Continue amlodipine  5mg  daily, lisinopril -hydrochlorothiazide  10-12.5mg  daily.  Recommend aiming for 150 minutes of moderate intensity activity per week and following a heart healthy diet.    Goiter - has been referred by endocrinology to Dr. Eletha for consideration of thyroidectomy given moderate compressive symptoms. Has upcoming further labs for monitoring of thyroid  function with endocrinology.   Screening for Secondary Hypertension:     05/10/2024    2:11 PM  Causes  Drugs/Herbals Screened     - Comments no added salt. + soda.  No EtOH.  No tobacco  Renovascular HTN Screened     - Comments check renal Dopplers  Sleep Apnea Screened     - Comments no symptoms  Thyroid  Disease Screened  Hyperaldosteronism Screened   Pheochromocytoma N/A  Cushing's Syndrome Screened  Hyperparathyroidism Screened  Coarctation of the Aorta Screened  Compliance Screened    Relevant Labs/Studies:    Latest Ref Rng & Units 11/27/2023    4:37 PM 05/14/2023   11:00 AM 01/16/2020    3:56 PM  Basic Labs  Sodium 135 - 145 mmol/L 135  139  139   Potassium 3.5 - 5.1 mmol/L 3.8  4.4  3.2   Creatinine 0.44 - 1.00 mg/dL 9.26  9.31  9.32        Latest Ref Rng & Units 05/13/2024    8:20 AM 05/14/2023   11:00 AM  Thyroid    TSH 0.450 - 4.500 uIU/mL 1.110  0.901        Latest Ref Rng & Units 05/13/2024    8:20 AM  Renin/Aldosterone   Aldosterone 0.0 - 30.0 ng/dL 5.3   Aldos/Renin Ratio 0.0 - 30.0 1.4                Disposition:    FU with MD/APP/PharmD in 2 months    Medication Adjustments/Labs and Tests Ordered: Current medicines are reviewed at length with the patient today.  Concerns regarding medicines are outlined above.  No orders of the defined types were placed in this encounter.  No orders of the defined types were placed in this encounter.    Signed, Reche GORMAN Finder, NP  07/19/2024 1:53 PM    [facility]

## 2024-07-19 NOTE — Patient Instructions (Addendum)
 Medication Instructions:   Your physician recommends that you continue on your current medications as directed. Please refer to the Current Medication list given to you today.    Labwork:  None     Follow-Up: Please follow up in _Feb 2026  in [facility] with Dr. Raford, Reche Finder, NP or Allean Mink PharmD     Special Instructions:   Reche Finder, NP will send in a new rx for a BP Cuff and this will be mailed to your home address  Recommend trying seated yoga videos on Youtube. You can also do the Fabulous 50s workouts on Youtube. These might be better tolerated with your shortness of breath.

## 2024-07-20 LAB — T3, FREE: T3, Free: 3 pg/mL (ref 2.0–4.4)

## 2024-07-20 LAB — T4, FREE: Free T4: 1.13 ng/dL (ref 0.82–1.77)

## 2024-07-20 LAB — THYROID PEROXIDASE ANTIBODY: Thyroperoxidase Ab SerPl-aCnc: <9 [IU]/mL (ref 0–34)

## 2024-07-20 LAB — TSH: TSH: 0.716 u[IU]/mL (ref 0.450–4.500)

## 2024-07-20 LAB — THYROGLOBULIN ANTIBODY: Thyroglobulin Antibody: 2.2 [IU]/mL — ABNORMAL HIGH (ref 0.0–0.9)

## 2024-07-21 ENCOUNTER — Ambulatory Visit: Payer: Self-pay | Admitting: Nurse Practitioner

## 2024-07-21 NOTE — Progress Notes (Signed)
 Noted , Patient has been sent a message going over her Thyroid  results.

## 2024-07-21 NOTE — Progress Notes (Signed)
 FYI I sent mychart message going over thyroid  lab results

## 2024-08-05 DIAGNOSIS — R1311 Dysphagia, oral phase: Secondary | ICD-10-CM | POA: Diagnosis not present

## 2024-08-05 DIAGNOSIS — E048 Other specified nontoxic goiter: Secondary | ICD-10-CM | POA: Diagnosis not present

## 2024-08-05 DIAGNOSIS — J398 Other specified diseases of upper respiratory tract: Secondary | ICD-10-CM | POA: Diagnosis not present

## 2024-08-09 ENCOUNTER — Other Ambulatory Visit: Payer: Self-pay | Admitting: Surgery

## 2024-08-09 DIAGNOSIS — E049 Nontoxic goiter, unspecified: Secondary | ICD-10-CM

## 2024-08-09 DIAGNOSIS — J398 Other specified diseases of upper respiratory tract: Secondary | ICD-10-CM

## 2024-08-09 DIAGNOSIS — R1310 Dysphagia, unspecified: Secondary | ICD-10-CM

## 2024-08-16 ENCOUNTER — Ambulatory Visit
Admission: RE | Admit: 2024-08-16 | Discharge: 2024-08-16 | Disposition: A | Source: Ambulatory Visit | Attending: Surgery | Admitting: Surgery

## 2024-08-16 DIAGNOSIS — R1310 Dysphagia, unspecified: Secondary | ICD-10-CM

## 2024-08-16 DIAGNOSIS — E049 Nontoxic goiter, unspecified: Secondary | ICD-10-CM

## 2024-08-16 DIAGNOSIS — J398 Other specified diseases of upper respiratory tract: Secondary | ICD-10-CM

## 2024-08-16 MED ORDER — IOPAMIDOL (ISOVUE-300) INJECTION 61%
75.0000 mL | Freq: Once | INTRAVENOUS | Status: AC | PRN
  Administered 2024-08-16: 75 mL via INTRAVENOUS

## 2024-08-26 ENCOUNTER — Encounter: Payer: Self-pay | Admitting: *Deleted

## 2024-09-06 ENCOUNTER — Ambulatory Visit: Admitting: Nurse Practitioner

## 2024-09-15 ENCOUNTER — Other Ambulatory Visit: Payer: Self-pay | Admitting: Nurse Practitioner

## 2024-09-15 DIAGNOSIS — F321 Major depressive disorder, single episode, moderate: Secondary | ICD-10-CM

## 2024-09-16 ENCOUNTER — Other Ambulatory Visit: Payer: Self-pay | Admitting: Nurse Practitioner

## 2024-09-16 DIAGNOSIS — I10 Essential (primary) hypertension: Secondary | ICD-10-CM

## 2024-09-27 ENCOUNTER — Encounter (HOSPITAL_BASED_OUTPATIENT_CLINIC_OR_DEPARTMENT_OTHER): Admitting: Family

## 2024-10-20 ENCOUNTER — Ambulatory Visit: Admitting: Nurse Practitioner

## 2024-10-20 ENCOUNTER — Ambulatory Visit: Admitting: Family Medicine

## 2024-11-01 ENCOUNTER — Encounter (HOSPITAL_BASED_OUTPATIENT_CLINIC_OR_DEPARTMENT_OTHER): Admitting: Family
